# Patient Record
Sex: Female | Born: 1945 | Smoking: Never smoker
Health system: Southern US, Academic
[De-identification: ages and names within clinical notes are randomized; demographics above are authoritative.]

## PROBLEM LIST (undated history)

## (undated) DIAGNOSIS — F329 Major depressive disorder, single episode, unspecified: Secondary | ICD-10-CM

## (undated) DIAGNOSIS — K219 Gastro-esophageal reflux disease without esophagitis: Secondary | ICD-10-CM

## (undated) DIAGNOSIS — Z923 Personal history of irradiation: Secondary | ICD-10-CM

## (undated) DIAGNOSIS — J069 Acute upper respiratory infection, unspecified: Secondary | ICD-10-CM

## (undated) DIAGNOSIS — M1990 Unspecified osteoarthritis, unspecified site: Secondary | ICD-10-CM

## (undated) DIAGNOSIS — R011 Cardiac murmur, unspecified: Secondary | ICD-10-CM

## (undated) DIAGNOSIS — C50919 Malignant neoplasm of unspecified site of unspecified female breast: Secondary | ICD-10-CM

## (undated) DIAGNOSIS — E785 Hyperlipidemia, unspecified: Secondary | ICD-10-CM

## (undated) DIAGNOSIS — B029 Zoster without complications: Secondary | ICD-10-CM

## (undated) DIAGNOSIS — F32A Depression, unspecified: Secondary | ICD-10-CM

## (undated) DIAGNOSIS — I10 Essential (primary) hypertension: Secondary | ICD-10-CM

## (undated) DIAGNOSIS — N951 Menopausal and female climacteric states: Secondary | ICD-10-CM

## (undated) DIAGNOSIS — Z853 Personal history of malignant neoplasm of breast: Secondary | ICD-10-CM

## (undated) HISTORY — PX: HX CATARACT REMOVAL: SHX102

## (undated) HISTORY — DX: Essential (primary) hypertension: I10

## (undated) HISTORY — DX: Personal history of malignant neoplasm of breast: Z85.3

## (undated) HISTORY — PX: CHOLECYSTECTOMY: SHX55

## (undated) HISTORY — DX: Malignant neoplasm of unspecified site of unspecified female breast: C50.919

## (undated) HISTORY — DX: Zoster without complications: B02.9

## (undated) HISTORY — PX: ABDOMINAL HYSTERECTOMY: SHX81

## (undated) HISTORY — DX: Menopausal and female climacteric states: N95.1

## (undated) HISTORY — PX: APPENDECTOMY: SHX54

## (undated) HISTORY — PX: BUNIONECTOMY: SHX129

## (undated) HISTORY — DX: Acute upper respiratory infection, unspecified: J06.9

## (undated) HISTORY — DX: Hyperlipidemia, unspecified: E78.5

---
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
## 1998-11-03 ENCOUNTER — Other Ambulatory Visit: Admission: RE | Admit: 1998-11-03 | Discharge: 1998-11-03 | Payer: Self-pay | Admitting: *Deleted

## 2001-03-14 ENCOUNTER — Encounter: Admission: RE | Admit: 2001-03-14 | Discharge: 2001-03-14 | Payer: Self-pay

## 2002-05-13 ENCOUNTER — Encounter: Admission: RE | Admit: 2002-05-13 | Discharge: 2002-05-13 | Payer: Self-pay

## 2003-05-28 ENCOUNTER — Encounter: Admission: RE | Admit: 2003-05-28 | Discharge: 2003-05-28 | Payer: Self-pay

## 2003-09-20 ENCOUNTER — Ambulatory Visit (HOSPITAL_COMMUNITY): Admission: RE | Admit: 2003-09-20 | Discharge: 2003-09-20 | Payer: Self-pay | Admitting: Gastroenterology

## 2003-09-21 ENCOUNTER — Ambulatory Visit (HOSPITAL_COMMUNITY): Admission: RE | Admit: 2003-09-21 | Discharge: 2003-09-21 | Payer: Self-pay | Admitting: Gastroenterology

## 2004-12-25 ENCOUNTER — Encounter: Admission: RE | Admit: 2004-12-25 | Discharge: 2004-12-25 | Payer: Self-pay | Admitting: Family Medicine

## 2005-05-14 DIAGNOSIS — C50919 Malignant neoplasm of unspecified site of unspecified female breast: Secondary | ICD-10-CM

## 2005-05-14 DIAGNOSIS — Z923 Personal history of irradiation: Secondary | ICD-10-CM

## 2005-05-14 HISTORY — DX: Malignant neoplasm of unspecified site of unspecified female breast: C50.919

## 2005-05-14 HISTORY — PX: BREAST BIOPSY: SHX20

## 2005-05-14 HISTORY — DX: Personal history of irradiation: Z92.3

## 2006-01-12 HISTORY — PX: BREAST LUMPECTOMY: SHX2

## 2006-01-24 ENCOUNTER — Encounter: Admission: RE | Admit: 2006-01-24 | Discharge: 2006-01-24 | Payer: Self-pay | Admitting: Family Medicine

## 2006-01-31 ENCOUNTER — Encounter: Admission: RE | Admit: 2006-01-31 | Discharge: 2006-01-31 | Payer: Self-pay | Admitting: Family Medicine

## 2006-01-31 ENCOUNTER — Encounter (INDEPENDENT_AMBULATORY_CARE_PROVIDER_SITE_OTHER): Payer: Self-pay | Admitting: *Deleted

## 2006-01-31 ENCOUNTER — Encounter (INDEPENDENT_AMBULATORY_CARE_PROVIDER_SITE_OTHER): Payer: Self-pay | Admitting: Diagnostic Radiology

## 2006-02-12 ENCOUNTER — Ambulatory Visit (HOSPITAL_COMMUNITY): Admission: RE | Admit: 2006-02-12 | Discharge: 2006-02-12 | Payer: Self-pay | Admitting: General Surgery

## 2006-02-19 ENCOUNTER — Encounter: Admission: RE | Admit: 2006-02-19 | Discharge: 2006-02-19 | Payer: Self-pay | Admitting: General Surgery

## 2006-02-21 ENCOUNTER — Ambulatory Visit (HOSPITAL_BASED_OUTPATIENT_CLINIC_OR_DEPARTMENT_OTHER): Admission: RE | Admit: 2006-02-21 | Discharge: 2006-02-21 | Payer: Self-pay | Admitting: General Surgery

## 2006-02-21 ENCOUNTER — Encounter (INDEPENDENT_AMBULATORY_CARE_PROVIDER_SITE_OTHER): Payer: Self-pay | Admitting: *Deleted

## 2006-02-21 ENCOUNTER — Encounter: Admission: RE | Admit: 2006-02-21 | Discharge: 2006-02-21 | Payer: Self-pay | Admitting: General Surgery

## 2006-02-22 ENCOUNTER — Ambulatory Visit: Payer: Self-pay | Admitting: Oncology

## 2006-03-07 ENCOUNTER — Ambulatory Visit: Admission: RE | Admit: 2006-03-07 | Discharge: 2006-06-05 | Payer: Self-pay | Admitting: Radiation Oncology

## 2006-03-14 ENCOUNTER — Ambulatory Visit (HOSPITAL_COMMUNITY): Admission: RE | Admit: 2006-03-14 | Discharge: 2006-03-14 | Payer: Self-pay | Admitting: General Surgery

## 2006-04-10 ENCOUNTER — Ambulatory Visit: Payer: Self-pay | Admitting: Oncology

## 2006-06-13 ENCOUNTER — Ambulatory Visit: Payer: Self-pay | Admitting: Oncology

## 2006-10-17 ENCOUNTER — Ambulatory Visit: Payer: Self-pay | Admitting: Oncology

## 2006-10-21 LAB — CBC WITH DIFFERENTIAL/PLATELET
Basophils Absolute: 0 10*3/uL (ref 0.0–0.1)
Eosinophils Absolute: 0 10*3/uL (ref 0.0–0.5)
HCT: 36.7 % (ref 34.8–46.6)
HGB: 13 g/dL (ref 11.6–15.9)
MCV: 92.4 fL (ref 81.0–101.0)
NEUT#: 3.9 10*3/uL (ref 1.5–6.5)
RDW: 12.9 % (ref 11.3–14.5)
lymph#: 1.2 10*3/uL (ref 0.9–3.3)

## 2006-10-29 ENCOUNTER — Encounter: Admission: RE | Admit: 2006-10-29 | Discharge: 2006-10-29 | Payer: Self-pay | Admitting: Radiation Oncology

## 2007-02-27 ENCOUNTER — Encounter: Admission: RE | Admit: 2007-02-27 | Discharge: 2007-02-27 | Payer: Self-pay | Admitting: Family Medicine

## 2007-03-05 ENCOUNTER — Other Ambulatory Visit: Admission: RE | Admit: 2007-03-05 | Discharge: 2007-03-05 | Payer: Self-pay | Admitting: Family Medicine

## 2007-03-12 ENCOUNTER — Encounter: Admission: RE | Admit: 2007-03-12 | Discharge: 2007-03-12 | Payer: Self-pay | Admitting: Family Medicine

## 2007-10-16 ENCOUNTER — Ambulatory Visit: Payer: Self-pay | Admitting: Oncology

## 2007-10-20 LAB — CBC WITH DIFFERENTIAL/PLATELET
Basophils Absolute: 0 10*3/uL (ref 0.0–0.1)
EOS%: 0 % (ref 0.0–7.0)
Eosinophils Absolute: 0 10*3/uL (ref 0.0–0.5)
HCT: 37.2 % (ref 34.8–46.6)
HGB: 13 g/dL (ref 11.6–15.9)
MCH: 32.2 pg (ref 26.0–34.0)
MONO#: 0.4 10*3/uL (ref 0.1–0.9)
NEUT#: 3.2 10*3/uL (ref 1.5–6.5)
RDW: 13.1 % (ref 11.3–14.5)
WBC: 4.9 10*3/uL (ref 3.9–10.0)
lymph#: 1.3 10*3/uL (ref 0.9–3.3)

## 2007-10-21 LAB — COMPREHENSIVE METABOLIC PANEL
AST: 32 U/L (ref 0–37)
BUN: 16 mg/dL (ref 6–23)
Calcium: 9.1 mg/dL (ref 8.4–10.5)
Chloride: 103 mEq/L (ref 96–112)
Creatinine, Ser: 0.76 mg/dL (ref 0.40–1.20)
Total Bilirubin: 0.3 mg/dL (ref 0.3–1.2)

## 2007-10-21 LAB — VITAMIN D 25 HYDROXY (VIT D DEFICIENCY, FRACTURES): Vit D, 25-Hydroxy: 42 ng/mL (ref 30–89)

## 2008-01-08 ENCOUNTER — Ambulatory Visit: Payer: Self-pay | Admitting: Psychiatry

## 2008-03-31 ENCOUNTER — Encounter: Admission: RE | Admit: 2008-03-31 | Discharge: 2008-03-31 | Payer: Self-pay | Admitting: Family Medicine

## 2008-09-17 ENCOUNTER — Ambulatory Visit: Payer: Self-pay | Admitting: Oncology

## 2008-09-21 LAB — CBC WITH DIFFERENTIAL/PLATELET
Basophils Absolute: 0 10*3/uL (ref 0.0–0.1)
HCT: 36.3 % (ref 34.8–46.6)
HGB: 12.8 g/dL (ref 11.6–15.9)
LYMPH%: 21.3 % (ref 14.0–49.7)
MCHC: 35.2 g/dL (ref 31.5–36.0)
MONO#: 0.4 10*3/uL (ref 0.1–0.9)
NEUT%: 71.9 % (ref 38.4–76.8)
Platelets: 168 10*3/uL (ref 145–400)
WBC: 5.7 10*3/uL (ref 3.9–10.3)
lymph#: 1.2 10*3/uL (ref 0.9–3.3)

## 2008-09-21 LAB — COMPREHENSIVE METABOLIC PANEL
BUN: 11 mg/dL (ref 6–23)
CO2: 24 mEq/L (ref 19–32)
Calcium: 8.9 mg/dL (ref 8.4–10.5)
Chloride: 101 mEq/L (ref 96–112)
Creatinine, Ser: 0.89 mg/dL (ref 0.40–1.20)
Glucose, Bld: 135 mg/dL — ABNORMAL HIGH (ref 70–99)
Total Bilirubin: 0.3 mg/dL (ref 0.3–1.2)

## 2008-10-05 LAB — RESEARCH LABS

## 2009-02-17 ENCOUNTER — Ambulatory Visit: Payer: Self-pay | Admitting: Oncology

## 2009-02-21 LAB — RESEARCH LABS

## 2009-04-01 ENCOUNTER — Encounter: Admission: RE | Admit: 2009-04-01 | Discharge: 2009-04-01 | Payer: Self-pay | Admitting: Internal Medicine

## 2010-02-13 ENCOUNTER — Ambulatory Visit: Payer: Self-pay | Admitting: Oncology

## 2010-02-22 LAB — CBC WITH DIFFERENTIAL/PLATELET
BASO%: 0.2 % (ref 0.0–2.0)
EOS%: 0 % (ref 0.0–7.0)
MCH: 31 pg (ref 25.1–34.0)
MCHC: 33.4 g/dL (ref 31.5–36.0)
MCV: 93.1 fL (ref 79.5–101.0)
MONO%: 10.3 % (ref 0.0–14.0)
RBC: 4.02 10*6/uL (ref 3.70–5.45)
RDW: 13.3 % (ref 11.2–14.5)
lymph#: 1 10*3/uL (ref 0.9–3.3)

## 2010-02-23 LAB — COMPREHENSIVE METABOLIC PANEL
ALT: 17 U/L (ref 0–35)
AST: 26 U/L (ref 0–37)
Albumin: 4.1 g/dL (ref 3.5–5.2)
Alkaline Phosphatase: 39 U/L (ref 39–117)
BUN: 16 mg/dL (ref 6–23)
Calcium: 9.3 mg/dL (ref 8.4–10.5)
Chloride: 103 mEq/L (ref 96–112)
Potassium: 4.1 mEq/L (ref 3.5–5.3)

## 2010-04-03 ENCOUNTER — Encounter: Admission: RE | Admit: 2010-04-03 | Discharge: 2010-04-03 | Payer: Self-pay | Admitting: Oncology

## 2010-09-29 NOTE — Op Note (Signed)
[name], Katelyn Knight              ACCOUNT NO.:  [id number]   MEDICAL RECORD NO.:  [id number]          PATIENT TYPE:  AMB   LOCATION:  DAY                          FACILITY:  [facility]   PHYSICIAN:  Rose Phi. Maple Hudson, M.D.   DATE OF BIRTH:  03-05-1946   DATE OF PROCEDURE:  03/14/2006  DATE OF DISCHARGE:                                 OPERATIVE REPORT   PREOPERATIVE DIAGNOSIS:  Stage I carcinoma of right breast.   POSTOPERATIVE DIAGNOSIS:  Stage I carcinoma of right breast.   OPERATION:  Insertion of MammoSite catheter.   SURGEON:  Rose Phi. Maple Hudson, M.D.   ANESTHESIA:  General.   OPERATIVE PROCEDURE:  The patient preferred general anesthesia and, after  general anesthesia been induced, she was placed in supine position with the  arms extended on the arm board.  The right breast was prepped and draped in  usual fashion.   We had actually ultrasounded her space and it looked regular and it looked  like the smaller balloon with 65-70 mL would be appropriate fit.   I made a short incision of the very lateral part of the old lumpectomy  incision which was fairly lateral anyway.  Easily then, using the hemostat,  punched into the seroma cavity and aspirated it.  We irrigated it.  We then  took the balloon and inserted it and I expanded it to 65 mL.   We then reultrasounded this and she had 1.3 cm skin to balloon distance and  it looked like there were no air pockets or irregularities in it.  The  balloon fit perfectly.   One 4-0 nylon suture was then placed in the skin and then there were no  sutures attached to the catheter.  Dressings were then applied and she was  transferred to recovery room in satisfactory condition having tolerated  procedure well.  She is to have a CT scan follow-up in 24 hours and  radiation oncology.      Rose Phi. Maple Hudson, M.D.  Electronically Signed     PRY/MEDQ  D:  03/14/2006  T:  03/14/2006  Job:  [id number]

## 2011-01-20 ENCOUNTER — Other Ambulatory Visit: Payer: Self-pay | Admitting: Internal Medicine

## 2011-02-22 ENCOUNTER — Other Ambulatory Visit: Payer: Self-pay | Admitting: Oncology

## 2011-02-22 ENCOUNTER — Encounter (HOSPITAL_BASED_OUTPATIENT_CLINIC_OR_DEPARTMENT_OTHER): Payer: BC Managed Care – PPO | Admitting: Oncology

## 2011-02-22 DIAGNOSIS — C50419 Malignant neoplasm of upper-outer quadrant of unspecified female breast: Secondary | ICD-10-CM

## 2011-02-22 DIAGNOSIS — C50919 Malignant neoplasm of unspecified site of unspecified female breast: Secondary | ICD-10-CM

## 2011-02-22 DIAGNOSIS — Z170 Estrogen receptor positive status [ER+]: Secondary | ICD-10-CM

## 2011-02-22 LAB — CBC WITH DIFFERENTIAL/PLATELET
Basophils Absolute: 0 10*3/uL (ref 0.0–0.1)
Eosinophils Absolute: 0 10*3/uL (ref 0.0–0.5)
HGB: 12.6 g/dL (ref 11.6–15.9)
LYMPH%: 30.2 % (ref 14.0–49.7)
MCV: 90.8 fL (ref 79.5–101.0)
MONO%: 10.5 % (ref 0.0–14.0)
NEUT#: 2.7 10*3/uL (ref 1.5–6.5)
NEUT%: 58.9 % (ref 38.4–76.8)
Platelets: 200 10*3/uL (ref 145–400)

## 2011-02-22 LAB — COMPREHENSIVE METABOLIC PANEL
ALT: 18 U/L (ref 0–35)
AST: 29 U/L (ref 0–37)
Alkaline Phosphatase: 48 U/L (ref 39–117)
Chloride: 99 mEq/L (ref 96–112)
Creatinine, Ser: 0.86 mg/dL (ref 0.50–1.10)
Total Bilirubin: 0.2 mg/dL — ABNORMAL LOW (ref 0.3–1.2)

## 2011-03-01 ENCOUNTER — Encounter (HOSPITAL_BASED_OUTPATIENT_CLINIC_OR_DEPARTMENT_OTHER): Payer: BC Managed Care – PPO | Admitting: Oncology

## 2011-03-01 DIAGNOSIS — C50919 Malignant neoplasm of unspecified site of unspecified female breast: Secondary | ICD-10-CM

## 2011-03-01 DIAGNOSIS — Z170 Estrogen receptor positive status [ER+]: Secondary | ICD-10-CM

## 2011-04-18 ENCOUNTER — Ambulatory Visit: Payer: BC Managed Care – PPO | Admitting: Internal Medicine

## 2011-05-04 ENCOUNTER — Other Ambulatory Visit: Payer: Self-pay | Admitting: Oncology

## 2011-05-04 DIAGNOSIS — C50419 Malignant neoplasm of upper-outer quadrant of unspecified female breast: Secondary | ICD-10-CM

## 2011-05-17 ENCOUNTER — Other Ambulatory Visit: Payer: Self-pay | Admitting: *Deleted

## 2011-05-17 DIAGNOSIS — C50419 Malignant neoplasm of upper-outer quadrant of unspecified female breast: Secondary | ICD-10-CM

## 2011-05-17 MED ORDER — VENLAFAXINE HCL ER 150 MG PO CP24: 150.0000 mg | ORAL_CAPSULE | Freq: Every day | ORAL | Status: DC

## 2011-05-30 ENCOUNTER — Encounter: Payer: Self-pay | Admitting: Internal Medicine

## 2011-05-30 ENCOUNTER — Ambulatory Visit (INDEPENDENT_AMBULATORY_CARE_PROVIDER_SITE_OTHER): Payer: Medicare Other | Admitting: Internal Medicine

## 2011-05-30 VITALS — BP 98/52 | HR 64 | Temp 97.6°F | Ht 65.0 in | Wt 159.0 lb

## 2011-05-30 DIAGNOSIS — K219 Gastro-esophageal reflux disease without esophagitis: Secondary | ICD-10-CM

## 2011-05-30 DIAGNOSIS — E785 Hyperlipidemia, unspecified: Secondary | ICD-10-CM

## 2011-05-30 DIAGNOSIS — I10 Essential (primary) hypertension: Secondary | ICD-10-CM

## 2011-05-30 DIAGNOSIS — G629 Polyneuropathy, unspecified: Secondary | ICD-10-CM | POA: Insufficient documentation

## 2011-05-30 MED ORDER — OMEPRAZOLE-SODIUM BICARBONATE 40-1100 MG PO CAPS: 1.0000 | ORAL_CAPSULE | Freq: Every day | ORAL | Status: DC

## 2011-05-30 MED ORDER — GABAPENTIN 300 MG PO CAPS: 300.0000 mg | ORAL_CAPSULE | Freq: Every day | ORAL | Status: DC

## 2011-05-30 MED ORDER — CHOLINE FENOFIBRATE 135 MG PO CPDR: 135.0000 mg | DELAYED_RELEASE_CAPSULE | Freq: Every day | ORAL | Status: DC

## 2011-05-30 MED ORDER — VALSARTAN-HYDROCHLOROTHIAZIDE 320-25 MG PO TABS: 1.0000 | ORAL_TABLET | Freq: Every day | ORAL | Status: DC

## 2011-05-30 NOTE — Progress Notes (Signed)
Subjective:    Patient ID: Katelyn Knight, female    DOB: 09/17/1945, 66 y.o.   MRN: [id number]  HPI 66 year old female with a history of hypertension presents for followup. She reports that she's been doing well. She reports full compliance with her medications. She denies any headache, palpitations, chest pain. She did not bring a record of her blood pressures today. She has no other concerns today.  Outpatient Encounter Prescriptions as of 05/30/2011  Medication Sig Dispense Refill  . ACTONEL 150 MG tablet TAKE 1 TABLET EVERY MONTH  4 tablet  2  . Choline Fenofibrate (TRILIPIX) 135 MG capsule Take 1 capsule (135 mg total) by mouth daily.  90 capsule  3  . gabapentin (NEURONTIN) 300 MG capsule Take 1 capsule (300 mg total) by mouth at bedtime.  90 capsule  3  . Grape Seed 100 MG CAPS Take 1 capsule by mouth daily.      . Misc Natural Products (OSTEO BI-FLEX ADV DOUBLE ST PO) Take by mouth.      . Multiple Vitamins-Minerals (MULTIVITAMIN WITH MINERALS) tablet Take 1 tablet by mouth daily.      Marland Kitchen omeprazole-sodium bicarbonate (ZEGERID) 40-1100 MG per capsule Take 1 capsule by mouth daily before breakfast.  90 capsule  3  . valsartan-hydrochlorothiazide (DIOVAN-HCT) 320-25 MG per tablet Take 1 tablet by mouth daily.  90 tablet  3  . venlafaxine (EFFEXOR-XR) 150 MG 24 hr capsule Take 1 capsule (150 mg total) by mouth daily.  90 capsule  0    Review of Systems  Constitutional: Negative for fever, chills, appetite change, fatigue and unexpected weight change.  HENT: Negative for ear pain, congestion, sore throat, trouble swallowing, neck pain, voice change and sinus pressure.   Eyes: Negative for visual disturbance.  Respiratory: Negative for cough, shortness of breath, wheezing and stridor.   Cardiovascular: Negative for chest pain, palpitations and leg swelling.  Gastrointestinal: Negative for nausea, vomiting, abdominal pain, diarrhea, constipation, blood in stool, abdominal distention and  anal bleeding.  Genitourinary: Negative for dysuria and flank pain.  Musculoskeletal: Negative for myalgias, arthralgias and gait problem.  Skin: Negative for color change and rash.  Neurological: Negative for dizziness and headaches.  Hematological: Negative for adenopathy. Does not bruise/bleed easily.  Psychiatric/Behavioral: Negative for suicidal ideas, sleep disturbance and dysphoric mood. The patient is not nervous/anxious.    BP 98/52  Pulse 64  Temp(Src) 97.6 F (36.4 C) (Oral)  Ht 5\' 5"  (1.651 m)  Wt 159 lb (72.122 kg)  BMI 26.46 kg/m2  SpO2 99%     Objective:   Physical Exam  Constitutional: She is oriented to person, place, and time. She appears well-developed and well-nourished. No distress.  HENT:  Head: Normocephalic and atraumatic.  Right Ear: External ear normal.  Left Ear: External ear normal.  Nose: Nose normal.  Mouth/Throat: Oropharynx is clear and moist. No oropharyngeal exudate.  Eyes: Conjunctivae are normal. Pupils are equal, round, and reactive to light. Right eye exhibits no discharge. Left eye exhibits no discharge. No scleral icterus.  Neck: Normal range of motion. Neck supple. No tracheal deviation present. No thyromegaly present.  Cardiovascular: Normal rate, regular rhythm, normal heart sounds and intact distal pulses.  Exam reveals no gallop and no friction rub.   No murmur heard. Pulmonary/Chest: Effort normal and breath sounds normal. No respiratory distress. She has no wheezes. She has no rales. She exhibits no tenderness.  Musculoskeletal: Normal range of motion. She exhibits no edema and no tenderness.  Lymphadenopathy:  She has no cervical adenopathy.  Neurological: She is alert and oriented to person, place, and time. No cranial nerve deficit. She exhibits normal muscle tone. Coordination normal.  Skin: Skin is warm and dry. No rash noted. She is not diaphoretic. No erythema. No pallor.  Psychiatric: She has a normal mood and affect. Her  behavior is normal. Judgment and thought content normal.          Assessment & Plan:  1. Hypertension - BP well controlled today. Will continue current medications. Repeat renal function with labs today. Follow up 6 months.

## 2011-06-15 ENCOUNTER — Other Ambulatory Visit: Payer: Self-pay | Admitting: Internal Medicine

## 2011-06-15 DIAGNOSIS — Z9889 Other specified postprocedural states: Secondary | ICD-10-CM

## 2011-06-15 DIAGNOSIS — Z853 Personal history of malignant neoplasm of breast: Secondary | ICD-10-CM

## 2011-06-18 ENCOUNTER — Encounter: Payer: Self-pay | Admitting: Internal Medicine

## 2011-06-22 ENCOUNTER — Telehealth: Payer: Self-pay | Admitting: *Deleted

## 2011-06-22 DIAGNOSIS — C50419 Malignant neoplasm of upper-outer quadrant of unspecified female breast: Secondary | ICD-10-CM

## 2011-06-22 NOTE — Telephone Encounter (Signed)
Patient requesting a call regarding a prescription.

## 2011-06-25 MED ORDER — VENLAFAXINE HCL ER 150 MG PO CP24: 150.0000 mg | ORAL_CAPSULE | Freq: Every day | ORAL | Status: DC

## 2011-06-25 NOTE — Telephone Encounter (Signed)
Cost is over 200$ thru mail order, sent to local pharm, she will call back if anything further is needed.

## 2011-06-27 ENCOUNTER — Ambulatory Visit
Admission: RE | Admit: 2011-06-27 | Discharge: 2011-06-27 | Disposition: A | Discharge status: Home / Self Care | Payer: Medicare Other | Source: Ambulatory Visit | Attending: Internal Medicine | Admitting: Internal Medicine

## 2011-06-27 ENCOUNTER — Telehealth: Payer: Self-pay | Admitting: Internal Medicine

## 2011-06-27 DIAGNOSIS — Z9889 Other specified postprocedural states: Secondary | ICD-10-CM

## 2011-06-27 DIAGNOSIS — Z853 Personal history of malignant neoplasm of breast: Secondary | ICD-10-CM

## 2011-06-27 NOTE — Telephone Encounter (Signed)
She can use generic effexor.  If she would like to change to another medication, needs a visit to discuss.

## 2011-06-27 NOTE — Telephone Encounter (Signed)
busy

## 2011-06-27 NOTE — Telephone Encounter (Signed)
The depression medication she is taking is to expensive is there another medication she can take that is not so expensive that could take the place of Effexor or its generic.

## 2011-07-02 NOTE — Telephone Encounter (Signed)
Patient informed, Generic effexor is still over 100$, I scheduled her to come in next week to discuss alternatives

## 2011-07-06 LAB — HM MAMMOGRAPHY: HM Mammogram: NORMAL

## 2011-07-11 ENCOUNTER — Ambulatory Visit (INDEPENDENT_AMBULATORY_CARE_PROVIDER_SITE_OTHER): Payer: Medicare Other | Admitting: Internal Medicine

## 2011-07-11 ENCOUNTER — Encounter: Payer: Self-pay | Admitting: Internal Medicine

## 2011-07-11 VITALS — BP 98/60 | HR 81 | Temp 98.1°F | Ht 65.0 in | Wt 160.0 lb

## 2011-07-11 DIAGNOSIS — F329 Major depressive disorder, single episode, unspecified: Secondary | ICD-10-CM

## 2011-07-11 DIAGNOSIS — C50419 Malignant neoplasm of upper-outer quadrant of unspecified female breast: Secondary | ICD-10-CM

## 2011-07-11 MED ORDER — VENLAFAXINE HCL ER 150 MG PO CP24: 150.0000 mg | ORAL_CAPSULE | Freq: Every day | ORAL | Status: DC

## 2011-07-11 NOTE — Assessment & Plan Note (Signed)
Symptoms well controlled on Effexor. Will try reordering this with her insurance company as she brings records today showing it is covered under Tier 2.

## 2011-07-11 NOTE — Progress Notes (Signed)
  Subjective:    Patient ID: Katelyn Knight, female    DOB: 08/19/1945, 66 y.o.   MRN: [id number]  HPI 66 year old female with history of depression presents to discuss issue with her depression medication. She notes that she was charged over $100 for her Effexor. She brings records today showing that the Effexor should be covered as a Tier 2 medication. She is interested in reordering her medication.   Review of Systems     Objective:   Physical Exam        Assessment & Plan:

## 2011-11-29 ENCOUNTER — Encounter: Payer: Self-pay | Admitting: Internal Medicine

## 2011-11-29 ENCOUNTER — Ambulatory Visit (INDEPENDENT_AMBULATORY_CARE_PROVIDER_SITE_OTHER): Payer: Medicare Other | Admitting: Internal Medicine

## 2011-11-29 VITALS — BP 96/64 | HR 58 | Temp 97.9°F | Ht 65.0 in | Wt 153.8 lb

## 2011-11-29 DIAGNOSIS — I10 Essential (primary) hypertension: Secondary | ICD-10-CM

## 2011-11-29 DIAGNOSIS — E785 Hyperlipidemia, unspecified: Secondary | ICD-10-CM

## 2011-11-29 LAB — COMPREHENSIVE METABOLIC PANEL
ALT: 21 U/L (ref 0–35)
CO2: 30 mEq/L (ref 19–32)
Creatinine, Ser: 1.2 mg/dL (ref 0.4–1.2)
GFR: 46.11 mL/min — ABNORMAL LOW (ref >60.00)
Total Bilirubin: 0.5 mg/dL (ref 0.3–1.2)

## 2011-11-29 LAB — LIPID PANEL
Cholesterol: 226 mg/dL — ABNORMAL HIGH (ref 0–200)
HDL: 62.9 mg/dL (ref >39.00)
VLDL: 19.2 mg/dL (ref 0.0–40.0)

## 2011-11-29 MED ORDER — FENOFIBRATE MICRONIZED 134 MG PO CAPS: 134.0000 mg | ORAL_CAPSULE | Freq: Every day | ORAL | Status: DC

## 2011-11-29 NOTE — Progress Notes (Signed)
Subjective:    Patient ID: Katelyn Knight, female    DOB: [birth year], 66 y.o.   MRN: [id number]  HPI 66 year old female with history of hypertension and hyperlipidemia presents for followup. She reports she is generally feeling well. She reports full compliance with her medications. She notes that her medication, Trilipix, is extremely expensive and she would like to change to generic fenofibrate.  Outpatient Encounter Prescriptions as of 11/29/2011  Medication Sig Dispense Refill  . gabapentin (NEURONTIN) 300 MG capsule Take 1 capsule (300 mg total) by mouth at bedtime.  90 capsule  3  . Misc Natural Products (OSTEO BI-FLEX ADV DOUBLE ST PO) Take by mouth.      . Multiple Vitamins-Minerals (MULTIVITAMIN WITH MINERALS) tablet Take 1 tablet by mouth daily.      Marland Kitchen omeprazole-sodium bicarbonate (ZEGERID) 40-1100 MG per capsule Take 1 capsule by mouth daily before breakfast.  90 capsule  3  . valsartan-hydrochlorothiazide (DIOVAN-HCT) 320-25 MG per tablet Take 1 tablet by mouth daily.  90 tablet  3  . venlafaxine (EFFEXOR-XR) 150 MG 24 hr capsule Take 1 capsule (150 mg total) by mouth daily.  30 capsule  1  . fenofibrate micronized (LOFIBRA) 134 MG capsule Take 1 capsule (134 mg total) by mouth daily before breakfast.  90 capsule  4    Review of Systems  Constitutional: Negative for fever, chills, appetite change, fatigue and unexpected weight change.  HENT: Negative for ear pain, congestion, sore throat, trouble swallowing, neck pain, voice change and sinus pressure.   Eyes: Negative for visual disturbance.  Respiratory: Negative for cough, shortness of breath, wheezing and stridor.   Cardiovascular: Negative for chest pain, palpitations and leg swelling.  Gastrointestinal: Negative for nausea, vomiting, abdominal pain, diarrhea, constipation, blood in stool, abdominal distention and anal bleeding.  Genitourinary: Negative for dysuria and flank pain.  Musculoskeletal: Negative for myalgias,  arthralgias and gait problem.  Skin: Negative for color change and rash.  Neurological: Negative for dizziness and headaches.  Hematological: Negative for adenopathy. Does not bruise/bleed easily.  Psychiatric/Behavioral: Negative for suicidal ideas, disturbed wake/sleep cycle and dysphoric mood. The patient is not nervous/anxious.        Objective:   Physical Exam  Constitutional: She is oriented to person, place, and time. She appears well-developed and well-nourished. No distress.  HENT:  Head: Normocephalic and atraumatic.  Right Ear: External ear normal.  Left Ear: External ear normal.  Nose: Nose normal.  Mouth/Throat: Oropharynx is clear and moist. No oropharyngeal exudate.  Eyes: Conjunctivae are normal. Pupils are equal, round, and reactive to light. Right eye exhibits no discharge. Left eye exhibits no discharge. No scleral icterus.  Neck: Normal range of motion. Neck supple. No tracheal deviation present. No thyromegaly present.  Cardiovascular: Normal rate, regular rhythm, normal heart sounds and intact distal pulses.  Exam reveals no gallop and no friction rub.   No murmur heard. Pulmonary/Chest: Effort normal and breath sounds normal. No respiratory distress. She has no wheezes. She has no rales. She exhibits no tenderness.  Musculoskeletal: Normal range of motion. She exhibits no edema and no tenderness.  Lymphadenopathy:    She has no cervical adenopathy.  Neurological: She is alert and oriented to person, place, and time. No cranial nerve deficit. She exhibits normal muscle tone. Coordination normal.  Skin: Skin is warm and dry. No rash noted. She is not diaphoretic. No erythema. No pallor.  Psychiatric: She has a normal mood and affect. Her behavior is normal. Judgment and thought content  normal.          Assessment & Plan:

## 2011-11-29 NOTE — Assessment & Plan Note (Signed)
Will check lipids and LFTs with labs today. Given cost of Trilipix, will change to generic fenofibrate. Follow up 6 months.

## 2011-11-29 NOTE — Assessment & Plan Note (Signed)
Blood pressure well-controlled on current medications. Will check renal function with labs today. Followup in 6 months or sooner as needed. 

## 2011-12-19 ENCOUNTER — Other Ambulatory Visit: Payer: Self-pay | Admitting: *Deleted

## 2011-12-19 ENCOUNTER — Encounter: Payer: Self-pay | Admitting: Internal Medicine

## 2011-12-19 DIAGNOSIS — F329 Major depressive disorder, single episode, unspecified: Secondary | ICD-10-CM

## 2011-12-19 MED ORDER — VENLAFAXINE HCL ER 150 MG PO CP24: 150.0000 mg | ORAL_CAPSULE | Freq: Every day | ORAL | Status: DC

## 2012-05-14 DIAGNOSIS — B029 Zoster without complications: Secondary | ICD-10-CM

## 2012-05-14 HISTORY — DX: Zoster without complications: B02.9

## 2012-06-04 ENCOUNTER — Encounter: Payer: Self-pay | Admitting: Internal Medicine

## 2012-06-04 ENCOUNTER — Ambulatory Visit (INDEPENDENT_AMBULATORY_CARE_PROVIDER_SITE_OTHER): Payer: Medicare Other | Admitting: Internal Medicine

## 2012-06-04 VITALS — BP 108/68 | HR 68 | Temp 98.1°F | Ht 64.5 in | Wt 148.0 lb

## 2012-06-04 DIAGNOSIS — F3289 Other specified depressive episodes: Secondary | ICD-10-CM

## 2012-06-04 DIAGNOSIS — M25359 Other instability, unspecified hip: Secondary | ICD-10-CM | POA: Insufficient documentation

## 2012-06-04 DIAGNOSIS — I10 Essential (primary) hypertension: Secondary | ICD-10-CM

## 2012-06-04 DIAGNOSIS — Z0000 Encounter for general adult medical examination without abnormal findings: Secondary | ICD-10-CM

## 2012-06-04 DIAGNOSIS — M720 Palmar fascial fibromatosis [Dupuytren]: Secondary | ICD-10-CM

## 2012-06-04 DIAGNOSIS — M2480 Other specific joint derangements of unspecified joint, not elsewhere classified: Secondary | ICD-10-CM

## 2012-06-04 DIAGNOSIS — E785 Hyperlipidemia, unspecified: Secondary | ICD-10-CM

## 2012-06-04 DIAGNOSIS — M24859 Other specific joint derangements of unspecified hip, not elsewhere classified: Secondary | ICD-10-CM

## 2012-06-04 DIAGNOSIS — F329 Major depressive disorder, single episode, unspecified: Secondary | ICD-10-CM

## 2012-06-04 DIAGNOSIS — D649 Anemia, unspecified: Secondary | ICD-10-CM

## 2012-06-04 LAB — CBC WITH DIFFERENTIAL/PLATELET
Basophils Absolute: 0 10*3/uL (ref 0.0–0.1)
Eosinophils Absolute: 0 10*3/uL (ref 0.0–0.7)
HCT: 39.2 % (ref 36.0–46.0)
Hemoglobin: 13.3 g/dL (ref 12.0–15.0)
Lymphs Abs: 1.1 10*3/uL (ref 0.7–4.0)
MCHC: 34 g/dL (ref 30.0–36.0)
Monocytes Relative: 10.9 % (ref 3.0–12.0)
Neutro Abs: 2.5 10*3/uL (ref 1.4–7.7)
RDW: 13.6 % (ref 11.5–14.6)

## 2012-06-04 LAB — COMPREHENSIVE METABOLIC PANEL
Alkaline Phosphatase: 49 U/L (ref 39–117)
BUN: 22 mg/dL (ref 6–23)
Glucose, Bld: 106 mg/dL — ABNORMAL HIGH (ref 70–99)
Sodium: 140 mEq/L (ref 135–145)
Total Bilirubin: 0.7 mg/dL (ref 0.3–1.2)

## 2012-06-04 LAB — LIPID PANEL
Cholesterol: 198 mg/dL (ref 0–200)
LDL Cholesterol: 126 mg/dL — ABNORMAL HIGH (ref 0–99)
Triglycerides: 81 mg/dL (ref 0.0–149.0)
VLDL: 16.2 mg/dL (ref 0.0–40.0)

## 2012-06-04 LAB — HM COLONOSCOPY

## 2012-06-04 MED ORDER — VENLAFAXINE HCL ER 75 MG PO CP24: 150.0000 mg | ORAL_CAPSULE | Freq: Every day | ORAL | Status: DC

## 2012-06-04 MED ORDER — LOSARTAN POTASSIUM-HCTZ 100-12.5 MG PO TABS: 1.0000 | ORAL_TABLET | Freq: Every day | ORAL | Status: DC

## 2012-06-04 MED ORDER — FENOFIBRATE MICRONIZED 134 MG PO CAPS: 134.0000 mg | ORAL_CAPSULE | Freq: Every day | ORAL | Status: DC

## 2012-06-04 MED ORDER — VENLAFAXINE HCL ER 75 MG PO CP24: 75.0000 mg | ORAL_CAPSULE | Freq: Every day | ORAL | Status: DC

## 2012-06-04 NOTE — Assessment & Plan Note (Signed)
Pt notes some intermittent instability of hip joints bilaterally. Denies pain. Exam normal. Discussed core strengthening exercise. Discussed referral to orthopedics for further evaluation, but pt would like to hold off for now.

## 2012-06-04 NOTE — Assessment & Plan Note (Signed)
Noted on exam today. Discussed potential referral to hand surgeon, however pt would like to hold off for now.

## 2012-06-04 NOTE — Progress Notes (Signed)
Subjective:    Patient ID: Katelyn Knight, female    DOB: 12-26-1945, 67 y.o.   MRN: [id number]  HPI  The patient is here for annual Medicare wellness examination and management of other chronic and acute problems.   The risk factors are reflected in the social history.  The roster of all physicians providing medical care to patient - is listed in the Snapshot section of the chart.  Activities of daily living:  The patient is 100% independent in all ADLs: dressing, toileting, feeding as well as independent mobility  Home safety : The patient has smoke detectors in the home. They wear seatbelts.  There are no firearms at home. There is no violence in the home.   There is no risks for hepatitis, STDs or HIV. There is no history of blood transfusion. They have no travel history to infectious disease endemic areas of the world.  The patient has seen their dentist in the last six month.  (Dr. Jarold Motto) They have seen their eye doctor in the last year. (Dr. Lawerance [facility]) No issues with hearing They have deferred audiologic testing in the last year.  They do not  have excessive sun exposure.   Discussed the need for sun protection: hats, long sleeves and use of sunscreen if there is significant sun exposure.   Diet: the importance of a healthy diet is discussed. They do have a healthy diet.  The benefits of regular aerobic exercise were discussed. She goes to gym with friend.  Depression screen: there are no signs or vegative symptoms of depression- irritability, change in appetite, anhedonia, sadness/tearfullness.  Cognitive assessment: the patient manages all their financial and personal affairs and is actively engaged. They could relate day,date,year and events.  The following portions of the patient's history were reviewed and updated as appropriate: allergies, current medications, past family history, past medical history,  past surgical history, past social history  and  problem list.  HCPOA - none at present  Visual acuity was not assessed per patient preference since she has regular follow up with her ophthalmologist. Hearing and body mass index were assessed and reviewed.   During the course of the visit the patient was educated and counseled about appropriate screening and preventive services including : fall prevention , diabetes screening, nutrition counseling, colorectal cancer screening, and recommended immunizations.    Pt is also concerned today about contractures bilateral medial palms. Unsure how long present. Not painful. No limitations on range of motion. Of fingers or grip strength.  Also notes some recent issues with bilateral hips, during which hips seem to slide out of socket. Denies pain. Denies any limitation on activity or gait instability. No falls. Planning to start at local gym.   Outpatient Encounter Prescriptions as of 06/04/2012  Medication Sig Dispense Refill  . fenofibrate micronized (LOFIBRA) 134 MG capsule Take 1 capsule (134 mg total) by mouth daily before breakfast.  90 capsule  4  . Misc Natural Products (OSTEO BI-FLEX ADV DOUBLE ST PO) Take by mouth.      . Multiple Vitamins-Minerals (MULTIVITAMIN WITH MINERALS) tablet Take 1 tablet by mouth daily.      Marland Kitchen venlafaxine XR (EFFEXOR-XR) 75 MG 24 hr capsule Take 1 capsule (75 mg total) by mouth daily.  30 capsule  3  . losartan-hydrochlorothiazide (HYZAAR) 100-12.5 MG per tablet Take 1 tablet by mouth daily.  90 tablet  3   BP 108/68  Pulse 68  Temp 98.1 F (36.7 C) (Oral)  Ht  5' 4.5" (1.638 m)  Wt 148 lb (67.132 kg)  BMI 25.01 kg/m2  SpO2 97%  Review of Systems  Constitutional: Negative for fever, chills, appetite change, fatigue and unexpected weight change.  HENT: Negative for ear pain, congestion, sore throat, trouble swallowing, neck pain, voice change and sinus pressure.   Eyes: Negative for visual disturbance.  Respiratory: Negative for cough, shortness of breath,  wheezing and stridor.   Cardiovascular: Negative for chest pain, palpitations and leg swelling.  Gastrointestinal: Negative for nausea, vomiting, abdominal pain, diarrhea, constipation, blood in stool, abdominal distention and anal bleeding.  Genitourinary: Negative for dysuria and flank pain.  Musculoskeletal: Negative for myalgias, arthralgias and gait problem.  Skin: Negative for color change and rash.  Neurological: Negative for dizziness and headaches.  Hematological: Negative for adenopathy. Does not bruise/bleed easily.  Psychiatric/Behavioral: Negative for suicidal ideas, sleep disturbance and dysphoric mood. The patient is not nervous/anxious.        Objective:   Physical Exam  Constitutional: She is oriented to person, place, and time. She appears well-developed and well-nourished. No distress.  HENT:  Head: Normocephalic and atraumatic.  Right Ear: External ear normal.  Left Ear: External ear normal.  Nose: Nose normal.  Mouth/Throat: Oropharynx is clear and moist. No oropharyngeal exudate.  Eyes: Conjunctivae normal are normal. Pupils are equal, round, and reactive to light. Right eye exhibits no discharge. Left eye exhibits no discharge. No scleral icterus.  Neck: Normal range of motion. Neck supple. No tracheal deviation present. No thyromegaly present.  Cardiovascular: Normal rate, regular rhythm, normal heart sounds and intact distal pulses.  Exam reveals no gallop and no friction rub.   No murmur heard. Pulmonary/Chest: Effort normal and breath sounds normal. No accessory muscle usage. Not tachypneic. No respiratory distress. She has no decreased breath sounds. She has no wheezes. She has no rhonchi. She has no rales. She exhibits no tenderness. Right breast exhibits no inverted nipple, no mass, no nipple discharge, no skin change and no tenderness. Left breast exhibits no inverted nipple, no mass, no nipple discharge, no skin change and no tenderness. Breasts are  symmetrical.    Abdominal: Soft. Bowel sounds are normal. She exhibits no distension. There is no tenderness. There is no rebound and no guarding.  Musculoskeletal: Normal range of motion. She exhibits no edema and no tenderness.       Hands: Lymphadenopathy:    She has no cervical adenopathy.  Neurological: She is alert and oriented to person, place, and time. No cranial nerve deficit. She exhibits normal muscle tone. Coordination normal.  Skin: Skin is warm and dry. No rash noted. She is not diaphoretic. No erythema. No pallor.  Psychiatric: She has a normal mood and affect. Her behavior is normal. Judgment and thought content normal.          Assessment & Plan:

## 2012-06-04 NOTE — Assessment & Plan Note (Signed)
General medical exam normal today including breast exam. Pelvic exam deferred per pt preference as s/p complete hysterectomy. Will check basic labs including CMP, CBC, lipids. Immunizations are UTD except for pneumovax and zostavax which were declined. Pt declines further colonoscopy. Appropriate screening performed. Discussed HCPOA and encouraged pt to consider setting this up. Follow up in 1 month and prn.

## 2012-06-04 NOTE — Assessment & Plan Note (Signed)
Will check lipids and LFTs with labs today. 

## 2012-06-04 NOTE — Assessment & Plan Note (Signed)
Good control of BP on Valsartan-HCTZ, however pt insurance coverage has changed and medication now very expensive. Will change to Losartan-HCTZ. Will plan to have pt RTC for recheck in 1 month.

## 2012-06-04 NOTE — Assessment & Plan Note (Signed)
Pt would like to stop Effexor. Will taper to 75mg  daily x 1 month and then reassess. If doing well, plan to taper to 37.5mg  daily x1 month then stop.

## 2012-06-05 ENCOUNTER — Encounter: Payer: Self-pay | Admitting: Internal Medicine

## 2012-06-06 MED ORDER — FLUOXETINE HCL 20 MG PO TABS: 20.0000 mg | ORAL_TABLET | Freq: Every day | ORAL | Status: DC

## 2012-06-17 ENCOUNTER — Encounter: Payer: Self-pay | Admitting: Internal Medicine

## 2012-06-30 ENCOUNTER — Ambulatory Visit (INDEPENDENT_AMBULATORY_CARE_PROVIDER_SITE_OTHER): Payer: Medicare Other | Admitting: Internal Medicine

## 2012-06-30 ENCOUNTER — Encounter: Payer: Self-pay | Admitting: Internal Medicine

## 2012-06-30 VITALS — BP 102/70 | HR 70 | Temp 97.7°F | Wt 149.0 lb

## 2012-06-30 DIAGNOSIS — F329 Major depressive disorder, single episode, unspecified: Secondary | ICD-10-CM

## 2012-06-30 DIAGNOSIS — I10 Essential (primary) hypertension: Secondary | ICD-10-CM

## 2012-06-30 MED ORDER — VENLAFAXINE HCL ER 150 MG PO CP24: 150.0000 mg | ORAL_CAPSULE | Freq: Every day | ORAL | Status: DC

## 2012-06-30 NOTE — Assessment & Plan Note (Signed)
Symptoms well controlled on Venlafaxine. Will continue. Follow up 6 months and prn.

## 2012-06-30 NOTE — Assessment & Plan Note (Signed)
BP Readings from Last 3 Encounters:  06/30/12 102/70  06/04/12 108/68  11/29/11 96/64   BP well controlled on Losartan-HCTZ. Will continue to monitor. Follow up 6 months and prn.

## 2012-06-30 NOTE — Progress Notes (Signed)
Subjective:    Patient ID: Katelyn Knight, female    DOB: 1946/01/31, 67 y.o.   MRN: [id number]  HPI 67YO female with h/o HTN and depression presents for follow up.  HTN - BP has been well controlled on current medications. Denies chest pain, palpitation, headache. Compliant with meds  Depression - Unable to tolerate Fluoxetine which was started last visit because of hallucinations. Started back on Venlafaxine.  Tolerating well.  Notes decreased libido. Question if any medication may help with this.  Outpatient Encounter Prescriptions as of 06/30/2012  Medication Sig Dispense Refill  . fenofibrate micronized (LOFIBRA) 134 MG capsule Take 1 capsule (134 mg total) by mouth daily before breakfast.  90 capsule  4  . losartan-hydrochlorothiazide (HYZAAR) 100-12.5 MG per tablet Take 1 tablet by mouth daily.  90 tablet  3  . Misc Natural Products (OSTEO BI-FLEX ADV DOUBLE ST PO) Take by mouth.      . Multiple Vitamins-Minerals (MULTIVITAMIN WITH MINERALS) tablet Take 1 tablet by mouth daily.      Marland Kitchen venlafaxine XR (EFFEXOR-XR) 150 MG 24 hr capsule Take 1 capsule (150 mg total) by mouth daily.  90 capsule  4  . [DISCONTINUED] venlafaxine XR (EFFEXOR-XR) 150 MG 24 hr capsule Take 150 mg by mouth daily.       . [DISCONTINUED] FLUoxetine (PROZAC) 20 MG tablet Take 1 tablet (20 mg total) by mouth daily.  30 tablet  3   No facility-administered encounter medications on file as of 06/30/2012.   BP 102/70  Pulse 70  Temp(Src) 97.7 F (36.5 C) (Oral)  Wt 149 lb (67.586 kg)  BMI 25.19 kg/m2  SpO2 97%  Review of Systems  Constitutional: Negative for fever, chills, appetite change, fatigue and unexpected weight change.  HENT: Negative for ear pain, congestion, sore throat, trouble swallowing, neck pain, voice change and sinus pressure.   Eyes: Negative for visual disturbance.  Respiratory: Negative for cough, shortness of breath, wheezing and stridor.   Cardiovascular: Negative for chest pain,  palpitations and leg swelling.  Gastrointestinal: Negative for nausea, vomiting, abdominal pain, diarrhea, constipation, blood in stool, abdominal distention and anal bleeding.  Genitourinary: Negative for dysuria and flank pain.  Musculoskeletal: Negative for myalgias, arthralgias and gait problem.  Skin: Negative for color change and rash.  Neurological: Negative for dizziness and headaches.  Hematological: Negative for adenopathy. Does not bruise/bleed easily.  Psychiatric/Behavioral: Negative for suicidal ideas, sleep disturbance and dysphoric mood. The patient is not nervous/anxious.        Objective:   Physical Exam  Constitutional: She is oriented to person, place, and time. She appears well-developed and well-nourished. No distress.  HENT:  Head: Normocephalic and atraumatic.  Right Ear: External ear normal.  Left Ear: External ear normal.  Nose: Nose normal.  Mouth/Throat: Oropharynx is clear and moist. No oropharyngeal exudate.  Eyes: Conjunctivae are normal. Pupils are equal, round, and reactive to light. Right eye exhibits no discharge. Left eye exhibits no discharge. No scleral icterus.  Neck: Normal range of motion. Neck supple. No tracheal deviation present. No thyromegaly present.  Cardiovascular: Normal rate, regular rhythm, normal heart sounds and intact distal pulses.  Exam reveals no gallop and no friction rub.   No murmur heard. Pulmonary/Chest: Effort normal and breath sounds normal. No respiratory distress. She has no wheezes. She has no rales. She exhibits no tenderness.  Musculoskeletal: Normal range of motion. She exhibits no edema and no tenderness.  Lymphadenopathy:    She has no cervical adenopathy.  Neurological: She is alert and oriented to person, place, and time. No cranial nerve deficit. She exhibits normal muscle tone. Coordination normal.  Skin: Skin is warm and dry. No rash noted. She is not diaphoretic. No erythema. No pallor.  Psychiatric: She has  a normal mood and affect. Her behavior is normal. Judgment and thought content normal.          Assessment & Plan:

## 2012-07-01 ENCOUNTER — Ambulatory Visit
Admission: RE | Admit: 2012-07-01 | Discharge: 2012-07-01 | Disposition: A | Discharge status: Home / Self Care | Payer: Medicare Other | Source: Ambulatory Visit | Attending: Internal Medicine | Admitting: Internal Medicine

## 2012-07-01 ENCOUNTER — Other Ambulatory Visit: Payer: Self-pay | Admitting: Internal Medicine

## 2012-07-01 DIAGNOSIS — C50911 Malignant neoplasm of unspecified site of right female breast: Secondary | ICD-10-CM

## 2012-07-01 DIAGNOSIS — Z0000 Encounter for general adult medical examination without abnormal findings: Secondary | ICD-10-CM

## 2012-08-01 ENCOUNTER — Encounter: Payer: Self-pay | Admitting: Internal Medicine

## 2012-09-18 ENCOUNTER — Encounter: Payer: Self-pay | Admitting: Internal Medicine

## 2012-09-18 ENCOUNTER — Telehealth: Payer: Self-pay | Admitting: Internal Medicine

## 2012-09-18 NOTE — Telephone Encounter (Signed)
Called patient but she was not available, husband gave me cell number but not correct number. Called [phone]

## 2012-09-18 NOTE — Telephone Encounter (Signed)
Patient Information:  Caller Name: Ying  Phone: [phone]  Patient: Katelyn Knight  Gender: Female  DOB: [birth year]  Age: 67 Years  PCP: Ronna Polio (Adults only)  Office Follow Up:  Does the office need to follow up with this patient?: Yes  Instructions For The Office: no appts available within dispositioned time frame krs/can  RN Note:  Patient calling about "burning tingly pain" along right side of chest around to the back.  No rash visible as yet.  Has not had shingles in the past, and has not received shingles vaccine.  Also had tick bite 09/14/12.  Was able to remove the entire tick.  Per rash protocol, disposition See Today in Office; info to office for provider/staff review/appt workin, as no appts available within dispositioned time frame and patient is calling prior to 1400.  May reach patient at [phone].  krs/can  Symptoms  Reason For Call & Symptoms: possible shingles  Reviewed Health History In EMR: Yes  Reviewed Medications In EMR: Yes  Reviewed Allergies In EMR: Yes  Reviewed Surgeries / Procedures: Yes  Date of Onset of Symptoms: 09/17/2012  Guideline(s) Used:  Rash or Redness - Localized  Disposition Per Guideline:   See Today in Office  Reason For Disposition Reached:   Lyme disease suspected (e.g., bull's-eye rash or tick bite / exposure)  Advice Given:  N/A  Patient Will Follow Care Advice:  YES

## 2012-09-19 ENCOUNTER — Telehealth: Payer: Self-pay | Admitting: Internal Medicine

## 2012-09-19 ENCOUNTER — Ambulatory Visit (INDEPENDENT_AMBULATORY_CARE_PROVIDER_SITE_OTHER): Payer: Medicare Other | Admitting: Adult Health

## 2012-09-19 ENCOUNTER — Encounter: Payer: Self-pay | Admitting: Adult Health

## 2012-09-19 VITALS — BP 122/66 | HR 66 | Resp 14 | Wt 144.0 lb

## 2012-09-19 DIAGNOSIS — R208 Other disturbances of skin sensation: Secondary | ICD-10-CM | POA: Insufficient documentation

## 2012-09-19 DIAGNOSIS — R209 Unspecified disturbances of skin sensation: Secondary | ICD-10-CM

## 2012-09-19 MED ORDER — VALACYCLOVIR HCL 1 G PO TABS: ORAL_TABLET | ORAL | Status: DC

## 2012-09-19 MED ORDER — HYDROCODONE-ACETAMINOPHEN 5-325 MG PO TABS: 1.0000 | ORAL_TABLET | Freq: Four times a day (QID) | ORAL | Status: DC | PRN

## 2012-09-19 NOTE — Assessment & Plan Note (Signed)
Possibility that this may be the prodromal period of pain and burning prior to the eruption of vesicles associated with shingles. Usually this occurs 2-3 days prior to the eruption. Start Norco every 6 hours as needed for pain. Since we are approaching the weekend, I have also given patient a prescription for valacyclovir 1000 mg every 8 hours x7 days for her to start taking ONLY if vesicles appear. Patient verbalized understanding.

## 2012-09-19 NOTE — Patient Instructions (Addendum)
  It appears that your superficial pain may be the prodrome of shingles. The burning sensation usually appears 2-3 days prior to the outbreak.  I am prescribing medication for pain. Norco can be taken every 6 hours as needed for pain.  I am also prescribing Valacyclovir (Valtrex) 1000 mg. This is an anti-viral. Only fill this prescription if you develop the shingles lesions. This medication is taken every 8 hours for 7 days.  Please call the office if your symptoms are not improved by Monday.

## 2012-09-19 NOTE — Telephone Encounter (Signed)
Caller: Shristi/Patient; Phone: [phone]; Reason for Call: Pt was returning a call from the office.  Pt states she thinks she might have shingles.  Pt was triaged yesterday (09/18/12), so RN did not re-triage.  RN was able to view in chart where Dr Dan Humphreys had responded back to patient email and said that it could possibly be early shingles and that she should be seen in the office.  RN was able to schedule appt for today (09/19/12) at 1500 with Merton Border, NP

## 2012-09-19 NOTE — Progress Notes (Signed)
  Subjective:    Patient ID: Katelyn Knight, female    DOB: Aug 30, 1945, 67 y.o.   MRN: [id number]  HPI  Patient is a pleasant 67 year old female who presents with superficial burning and painful sensation that starts under her right breast and goes around to her back. Symptoms started 2 days ago. She denies any lesions. She denies deep pain sensation. She describes the pain as "my skin feels like it is burning". Patient has not been able to sleep in the past 2 nights secondary to the symptoms. Patient denies injury, heavy lifting, exercising or anything that could have brought on this pain. She denies fever or chills.   Current Outpatient Prescriptions on File Prior to Visit  Medication Sig Dispense Refill  . fenofibrate micronized (LOFIBRA) 134 MG capsule Take 1 capsule (134 mg total) by mouth daily before breakfast.  90 capsule  4  . Misc Natural Products (OSTEO BI-FLEX ADV DOUBLE ST PO) Take by mouth.      . Multiple Vitamins-Minerals (MULTIVITAMIN WITH MINERALS) tablet Take 1 tablet by mouth daily.      Marland Kitchen venlafaxine XR (EFFEXOR-XR) 150 MG 24 hr capsule Take 1 capsule (150 mg total) by mouth daily.  90 capsule  4   No current facility-administered medications on file prior to visit.    Review of Systems  Constitutional: Positive for chills. Negative for fever.  Respiratory: Negative.   Cardiovascular: Negative.   Gastrointestinal: Negative.   Skin: Negative for rash.       Painful skin sensation on right side under breast around to her back.  Psychiatric/Behavioral: Negative.        Objective:   Physical Exam  Constitutional: She is oriented to person, place, and time. She appears well-developed and well-nourished. No distress.  Pulmonary/Chest: Effort normal. No respiratory distress.  Neurological: She is alert and oriented to person, place, and time.  Skin: Skin is warm and dry. No rash noted. No erythema.  Skin is intact. Painful upon palpation of the skin under right  breast and around towards the back  Psychiatric: She has a normal mood and affect. Her behavior is normal. Judgment and thought content normal.          Assessment & Plan:

## 2012-09-26 ENCOUNTER — Encounter: Payer: Self-pay | Admitting: Internal Medicine

## 2012-11-19 ENCOUNTER — Encounter: Payer: Self-pay | Admitting: Internal Medicine

## 2012-11-19 ENCOUNTER — Telehealth: Payer: Self-pay | Admitting: Internal Medicine

## 2012-11-19 NOTE — Telephone Encounter (Signed)
Patient Information:  Caller Name: Analyce  Phone: [phone]  Patient: Leia, Coletti  Gender: Female  DOB: [birth year]  Age: 67 Years  PCP: Ronna Polio (Adults only)  Office Follow Up:  Does the office need to follow up with this patient?: Yes  Instructions For The Office: Has see today in office disposition   Symptoms  Reason For Call & Symptoms: Niva states she has a UTI. Had onset of pain with urination on 11/18/12. Rates pain an  8 on 1/10 pt scale. No fever. No back or side pain. c/o lower abdominal pressure. Is able to void. Voided blood with blood clots at 0330. Has taken AZO x 2 on 11/19/12 with no reilef. Per urination pain protocol has see today in office disposition due to age > 77. No appt left in [facility]. PLEASE CALL Terricka AT [phone]- HAS SEE TODAY IN OFFICE DISPOSITION DUE TO AGE > 50 per urination pain protocol. Reviewed Health History In EMR: Yes  Reviewed Medications In EMR: Yes  Reviewed Allergies In EMR: Yes  Reviewed Surgeries / Procedures: Yes  Date of Onset of Symptoms: 11/18/2012  Treatments Tried: AZO  Treatments Tried Worked: No  Guideline(s) Used:  Urination Pain - Female  Disposition Per Guideline:   See Today in Office  Reason For Disposition Reached:   Age > 50 years  Advice Given:  Warm Saline SITZ Baths to Reduce Pain:  Sit in a warm saline bath for 20 minutes to cleanse the area and to reduce pain. Add 2 oz. of table salt or baking soda to a tub of water.  Fluids:   Drink extra fluids. Drink 8-10 glasses of liquids a day (Reason: to produce a dilute, non-irritating urine).  Patient Will Follow Care Advice:  YES

## 2012-11-20 ENCOUNTER — Encounter: Payer: Self-pay | Admitting: Adult Health

## 2012-11-20 ENCOUNTER — Ambulatory Visit (INDEPENDENT_AMBULATORY_CARE_PROVIDER_SITE_OTHER): Payer: Medicare Other | Admitting: Adult Health

## 2012-11-20 VITALS — BP 118/66 | HR 64 | Temp 98.0°F | Resp 12 | Wt 144.0 lb

## 2012-11-20 DIAGNOSIS — R300 Dysuria: Secondary | ICD-10-CM | POA: Insufficient documentation

## 2012-11-20 LAB — POCT URINALYSIS DIPSTICK
Protein, UA: NEGATIVE
Spec Grav, UA: <=1.005
Urobilinogen, UA: 0.2

## 2012-11-20 MED ORDER — CIPROFLOXACIN HCL 250 MG PO TABS: 250.0000 mg | ORAL_TABLET | Freq: Two times a day (BID) | ORAL | Status: DC

## 2012-11-20 NOTE — Progress Notes (Signed)
  Subjective:    Patient ID: Katelyn Knight, female    DOB: 12-19-1945, 67 y.o.   MRN: [id number]  HPI  Patient is a pleasant 67 y/o female who presents with dysuria, hematuria and passing blood clots. Denies fever or chills. Symptoms started yesterday.   Current Outpatient Prescriptions on File Prior to Visit  Medication Sig Dispense Refill  . fenofibrate micronized (LOFIBRA) 134 MG capsule Take 1 capsule (134 mg total) by mouth daily before breakfast.  90 capsule  4  . HYDROcodone-acetaminophen (NORCO) 5-325 MG per tablet Take 1 tablet by mouth every 6 (six) hours as needed for pain.  30 tablet  0  . losartan-hydrochlorothiazide (HYZAAR) 100-12.5 MG per tablet Take 0.5 tablets by mouth daily.      . Misc Natural Products (OSTEO BI-FLEX ADV DOUBLE ST PO) Take by mouth.      . Multiple Vitamins-Minerals (MULTIVITAMIN WITH MINERALS) tablet Take 1 tablet by mouth daily.      Marland Kitchen venlafaxine XR (EFFEXOR-XR) 150 MG 24 hr capsule Take 1 capsule (150 mg total) by mouth daily.  90 capsule  4   No current facility-administered medications on file prior to visit.     Review of Systems  Constitutional: Negative for fever, chills and fatigue.  Genitourinary: Positive for dysuria, frequency and hematuria. Negative for flank pain.   BP 118/66  Pulse 64  Temp(Src) 98 F (36.7 C) (Oral)  Resp 12  Wt 144 lb (65.318 kg)  BMI 24.34 kg/m2  SpO2 97%     Objective:   Physical Exam  Constitutional: She is oriented to person, place, and time. She appears well-developed and well-nourished.  Cardiovascular: Normal rate and regular rhythm.   Pulmonary/Chest: Effort normal. No respiratory distress.  Genitourinary:  Suprapubic tenderness. No costovertebral tenderness.  Neurological: She is alert and oriented to person, place, and time.  Psychiatric: She has a normal mood and affect. Her behavior is normal. Judgment and thought content normal.       Assessment & Plan:

## 2012-11-20 NOTE — Assessment & Plan Note (Signed)
UA dipstick shows trace blood, positive nitrites, trace leukocytes. Start Cipro 250 mg twice a day x3 days. Provided patient with 12 tablets. [place] so I provided extra medication in the event her symptoms do not improve within 2-3 days.

## 2012-11-20 NOTE — Telephone Encounter (Signed)
Patient Information:  Caller Name: Deb  Phone: [phone]  Patient: Katelyn Knight, Katelyn Knight  Gender: Female  DOB: 04-01-1946  Age: 67 Years  PCP: Ronna Polio (Adults only)  Office Follow Up:  Does the office need to follow up with this patient?: No  Instructions For The Office: N/A   Symptoms  Reason For Call & Symptoms: Dysuria with urgency; Called 11/19/12 for assistance; after triage, reported no one called her back regarding appointment or treatment. Declined triage.  Denies blood in urine, back or flank pain.  On AzoStandard. Advised must be seen for antibiotics per MD orders.  Late afternoon appointment scheduled per caller request.  Reviewed Health History In EMR: N/A  Reviewed Medications In EMR: N/A  Reviewed Allergies In EMR: N/A  Reviewed Surgeries / Procedures: N/A  Date of Onset of Symptoms: 11/18/2012  Treatments Tried: Azo Standard, > water intake  Treatments Tried Worked: No  Guideline(s) Used:  No Protocol Available - Sick Adult  Disposition Per Guideline:   See Today in Office  Reason For Disposition Reached:   Nursing judgment  Advice Given:  Call Back If:  New symptoms develop  You become worse.  Patient Will Follow Care Advice:  YES  Appointment Scheduled:  11/20/2012 15:00:00 Appointment Scheduled Provider:  Orville Govern

## 2012-11-20 NOTE — Patient Instructions (Addendum)
Urinary Tract Infection  Urinary tract infections (UTIs) can develop anywhere along your urinary tract. Your urinary tract is your body's drainage system for removing wastes and extra water. Your urinary tract includes two kidneys, two ureters, a bladder, and a urethra. Your kidneys are a pair of bean-shaped organs. Each kidney is about the size of your fist. They are located below your ribs, one on each side of your spine.  CAUSES  Infections are caused by microbes, which are microscopic organisms, including fungi, viruses, and bacteria. These organisms are so small that they can only be seen through a microscope. Bacteria are the microbes that most commonly cause UTIs.  SYMPTOMS   Symptoms of UTIs may vary by age and gender of the patient and by the location of the infection. Symptoms in young women typically include a frequent and intense urge to urinate and a painful, burning feeling in the bladder or urethra during urination. Older women and men are more likely to be tired, shaky, and weak and have muscle aches and abdominal pain. A fever may mean the infection is in your kidneys. Other symptoms of a kidney infection include pain in your back or sides below the ribs, nausea, and vomiting.  DIAGNOSIS  To diagnose a UTI, your caregiver will ask you about your symptoms. Your caregiver also will ask to provide a urine sample. The urine sample will be tested for bacteria and white blood cells. White blood cells are made by your body to help fight infection.  TREATMENT   Typically, UTIs can be treated with medication. Because most UTIs are caused by a bacterial infection, they usually can be treated with the use of antibiotics. The choice of antibiotic and length of treatment depend on your symptoms and the type of bacteria causing your infection.  HOME CARE INSTRUCTIONS   If you were prescribed antibiotics, take them exactly as your caregiver instructs you. Finish the medication even if you feel better after you  have only taken some of the medication.   Drink enough water and fluids to keep your urine clear or pale yellow.   Avoid caffeine, tea, and carbonated beverages. They tend to irritate your bladder.   Empty your bladder often. Avoid holding urine for long periods of time.   Empty your bladder before and after sexual intercourse.   After a bowel movement, women should cleanse from front to back. Use each tissue only once.  SEEK MEDICAL CARE IF:    You have back pain.   You develop a fever.   Your symptoms do not begin to resolve within 3 days.  SEEK IMMEDIATE MEDICAL CARE IF:    You have severe back pain or lower abdominal pain.   You develop chills.   You have nausea or vomiting.   You have continued burning or discomfort with urination.  MAKE SURE YOU:    Understand these instructions.   Will watch your condition.   Will get help right away if you are not doing well or get worse.  Document Released: 02/07/2005 Document Revised: 10/30/2011 Document Reviewed: 06/08/2011  [employer] Patient Information 2014 [employer]

## 2012-12-17 ENCOUNTER — Other Ambulatory Visit: Payer: Self-pay

## 2013-01-07 ENCOUNTER — Telehealth: Payer: Self-pay | Admitting: *Deleted

## 2013-01-07 DIAGNOSIS — G629 Polyneuropathy, unspecified: Secondary | ICD-10-CM

## 2013-01-07 MED ORDER — GABAPENTIN 300 MG PO CAPS: 300.0000 mg | ORAL_CAPSULE | Freq: Every day | ORAL | Status: DC

## 2013-01-07 NOTE — Telephone Encounter (Signed)
Patient left a voicemail stating at her last OV, she spoke with Dr. Dan Humphreys about her taking Gabapentin. She would like a prescription sent to the pharmacy, she really can not sleep without it. Please send to [facility]. Please send her a Mychart message when this is done. She would like to know if it's ok to refill or will she need to be seen?

## 2013-01-07 NOTE — Telephone Encounter (Signed)
Fine to refill Gabapentin

## 2013-03-02 ENCOUNTER — Encounter: Payer: Self-pay | Admitting: *Deleted

## 2013-03-03 ENCOUNTER — Encounter: Payer: Self-pay | Admitting: Internal Medicine

## 2013-03-03 ENCOUNTER — Ambulatory Visit (INDEPENDENT_AMBULATORY_CARE_PROVIDER_SITE_OTHER): Payer: Medicare Other | Admitting: Internal Medicine

## 2013-03-03 VITALS — BP 120/68 | HR 67 | Temp 98.2°F | Wt 146.0 lb

## 2013-03-03 DIAGNOSIS — R1031 Right lower quadrant pain: Secondary | ICD-10-CM

## 2013-03-03 DIAGNOSIS — Z23 Encounter for immunization: Secondary | ICD-10-CM

## 2013-03-03 DIAGNOSIS — I10 Essential (primary) hypertension: Secondary | ICD-10-CM

## 2013-03-03 DIAGNOSIS — R109 Unspecified abdominal pain: Secondary | ICD-10-CM

## 2013-03-03 NOTE — Assessment & Plan Note (Signed)
BP Readings from Last 3 Encounters:  03/03/13 120/68  11/20/12 118/66  09/19/12 122/66   BP well controlled on Losartan-HCTZ. Will continue. Plan to check renal function at next visit per pt preference.

## 2013-03-03 NOTE — Progress Notes (Signed)
Subjective:    Patient ID: Katelyn Knight, female    DOB: 05-28-1945, 67 y.o.   MRN: [id number]  HPI 67 year old female with history of hypertension, depression presents for followup. She reports she was able to wean herself off Effexor and onto Prozac alone. She did this because of significant increase in cost of Effexor. She reports symptoms have been well-controlled on fluoxetine alone.  In regards to elevated blood pressure, she questions whether she really needs dose of losartan hydrochlorothiazide. She does not regularly check her blood pressure. She denies any headache, chest pain, palpitations. She does occasionally miss a dose of the medicine.  She is concerned about two-year history of intermittent sharp pain in her groin. This occurs both on the left and right intermittently. The pain is sharp and fleeting. When she develops the pain she develops sudden sensation that her leg will "give way." She has had falls as a result of her legs giving out on her. She denies back pain, trauma to her back, loss of control of bowel or bladder. She denies any persistent weakness in her legs.   Outpatient Encounter Prescriptions as of 03/03/2013  Medication Sig Dispense Refill  . fenofibrate micronized (LOFIBRA) 134 MG capsule Take 1 capsule (134 mg total) by mouth daily before breakfast.  90 capsule  4  . FLUoxetine (PROZAC) 20 MG capsule Take 20 mg by mouth daily.      Marland Kitchen gabapentin (NEURONTIN) 300 MG capsule Take 1 capsule (300 mg total) by mouth at bedtime.  90 capsule  3  . losartan-hydrochlorothiazide (HYZAAR) 100-12.5 MG per tablet Take 0.5 tablets by mouth daily.      . Misc Natural Products (OSTEO BI-FLEX ADV DOUBLE ST PO) Take by mouth.      . Multiple Vitamins-Minerals (MULTIVITAMIN WITH MINERALS) tablet Take 1 tablet by mouth daily.       No facility-administered encounter medications on file as of 03/03/2013.   BP 120/68  Pulse 67  Temp(Src) 98.2 F (36.8 C) (Oral)  Wt 146 lb  (66.225 kg)  BMI 24.68 kg/m2  SpO2 98%  Review of Systems  Constitutional: Negative for fever, chills, appetite change, fatigue and unexpected weight change.  HENT: Negative for congestion, ear pain, sinus pressure, sore throat, trouble swallowing and voice change.   Eyes: Negative for visual disturbance.  Respiratory: Negative for cough, shortness of breath, wheezing and stridor.   Cardiovascular: Negative for chest pain, palpitations and leg swelling.  Gastrointestinal: Negative for nausea, vomiting, abdominal pain, diarrhea, constipation, blood in stool, abdominal distention and anal bleeding.  Genitourinary: Negative for dysuria and flank pain.  Musculoskeletal: Positive for arthralgias and myalgias. Negative for gait problem and neck pain.  Skin: Negative for color change and rash.  Neurological: Positive for weakness (intermittent bilateral leg). Negative for dizziness and headaches.  Hematological: Negative for adenopathy. Does not bruise/bleed easily.  Psychiatric/Behavioral: Negative for suicidal ideas, sleep disturbance and dysphoric mood. The patient is not nervous/anxious.        Objective:   Physical Exam  Constitutional: She is oriented to person, place, and time. She appears well-developed and well-nourished. No distress.  HENT:  Head: Normocephalic and atraumatic.  Right Ear: External ear normal.  Left Ear: External ear normal.  Nose: Nose normal.  Mouth/Throat: Oropharynx is clear and moist. No oropharyngeal exudate.  Eyes: Conjunctivae are normal. Pupils are equal, round, and reactive to light. Right eye exhibits no discharge. Left eye exhibits no discharge. No scleral icterus.  Neck: Normal range of motion.  Neck supple. No tracheal deviation present. No thyromegaly present.  Cardiovascular: Normal rate, regular rhythm, normal heart sounds and intact distal pulses.  Exam reveals no gallop and no friction rub.   No murmur heard. Pulmonary/Chest: Effort normal and  breath sounds normal. No accessory muscle usage. Not tachypneic. No respiratory distress. She has no decreased breath sounds. She has no wheezes. She has no rhonchi. She has no rales. She exhibits no tenderness.  Musculoskeletal: Normal range of motion. She exhibits no edema and no tenderness.  Lymphadenopathy:    She has no cervical adenopathy.  Neurological: She is alert and oriented to person, place, and time. She displays no atrophy and no tremor. No cranial nerve deficit or sensory deficit. She exhibits normal muscle tone. Coordination and gait normal.  Reflex Scores:      Patellar reflexes are 2+ on the right side and 2+ on the left side. Skin: Skin is warm and dry. No rash noted. She is not diaphoretic. No erythema. No pallor.  Psychiatric: She has a normal mood and affect. Her behavior is normal. Judgment and thought content normal.          Assessment & Plan:

## 2013-03-03 NOTE — Assessment & Plan Note (Signed)
Intermittent, sharp bilateral groin pain, L>R, with intermittent weakness in affected leg. Unclear etiology. No h/o back pain, trauma to lower back. Exam normal today. Will set up sports medicine evaluation. Question if MRI lumbar spine might be helpful for further evaluation.

## 2013-03-10 ENCOUNTER — Encounter: Payer: Self-pay | Admitting: Family Medicine

## 2013-03-10 ENCOUNTER — Ambulatory Visit (INDEPENDENT_AMBULATORY_CARE_PROVIDER_SITE_OTHER): Payer: Medicare Other | Admitting: Family Medicine

## 2013-03-10 ENCOUNTER — Other Ambulatory Visit (INDEPENDENT_AMBULATORY_CARE_PROVIDER_SITE_OTHER): Payer: Medicare Other

## 2013-03-10 VITALS — BP 128/62 | HR 66 | Wt 146.0 lb

## 2013-03-10 DIAGNOSIS — R1030 Lower abdominal pain, unspecified: Secondary | ICD-10-CM

## 2013-03-10 DIAGNOSIS — R109 Unspecified abdominal pain: Secondary | ICD-10-CM

## 2013-03-10 DIAGNOSIS — R1031 Right lower quadrant pain: Secondary | ICD-10-CM

## 2013-03-10 NOTE — Assessment & Plan Note (Addendum)
Secondary to chronic adductor tendinopathy and muscle imbalances. I do believe that patient's pain is not affecting her activities of daily living overall is been able to function well. Patient was found to have some weakness with the hip musculature. Patient does go to a gym regularly. Patient was given exercises that she should do on a regular basis. We did discuss over-the-counter medications that can be beneficial for this type of pain. We discussed proper shoe choices. Patient will try all these interventions and come back in 4 weeks. If she continues to have pain at that time I would like to do is ultrasound the area to see if there is any tear or any other abnormalities that would warrant further evaluation and workup.

## 2013-03-10 NOTE — Patient Instructions (Signed)
Very nice to meet you Try these exercises daily  Hip abductor strengthening - lay on side lift top leg 6 inches hold, 2 seconds, and lower slowly for 4 count. Do 30 reps daily, each side for next 2 weeks, then 2 sets each side daily.  Fish oil 3 grams daily Black cohosh for the hot flashes  Tylenol three times a da for the next 5 days.  Ice can help after activity Come back in 4 weeks.

## 2013-03-10 NOTE — Progress Notes (Signed)
I'm seeing this patient by the request  of:  Wynona Dove, MD   CC: Bilateral groin pain  HPI: Patient is a very pleasant 67 year old female who is referred here for bilateral groin pain. Patient states she's had this pain for approximately 2 years. It seems to be more of an intermittent sharp pain in her groin. Patient describes the pain is more of a sharp sensation that goes away fairly quickly. When she has the pain though it seems to radiate a little bit down her legs which causes her to feel like she is going to fall.  Patient denies any numbness or weakness in the lower extremities. Patient also denies any back pain, fevers or chills. Patient does not remember any true injury to this area. Patient states certain movements seem to make it worse. Patient denies any nighttime awakening. Patient has the pain though when it occurs is 8/10. Patient also denies any bowel or bladder incontinence. Patient states she can have days and even weeks where she has no pain.  Past medical, surgical, family and social history reviewed. Medications reviewed all in the electronic medical record.   Review of Systems: No headache, visual changes, nausea, vomiting, diarrhea, constipation, dizziness, abdominal pain, skin rash, fevers, chills, night sweats, weight loss, swollen lymph nodes, body aches, joint swelling, muscle aches, chest pain, shortness of breath, mood changes.   Objective:    Weight 146 lb (66.225 kg).   General: No apparent distress alert and oriented x3 mood and affect normal, dressed appropriately.  HEENT: Pupils equal, extraocular movements intact Respiratory: Patient's speak in full sentences and does not appear short of breath Cardiovascular: No lower extremity edema, non tender, no erythema Skin: Warm dry intact with no signs of infection or rash on extremities or on axial skeleton. Abdomen: Soft nontender Neuro: Cranial nerves II through XII are intact, neurovascularly intact  in all extremities with 2+ DTRs and 2+ pulses. Lymph: No lymphadenopathy of posterior or anterior cervical chain or axillae bilaterally.  Gait normal with good balance and coordination.  MSK: Non tender with full range of motion and good stability and symmetric strength and tone of shoulders, elbows, wrist,  knee and ankles bilaterally.  Back Exam:  Inspection: Unremarkable  Motion: Flexion 45 deg, Extension 45 deg, Side Bending to 45 deg bilaterally,  Rotation to 45 deg bilaterally  SLR laying: Negative  XSLR laying: Negative  Palpable tenderness: None. FABER: negative. Sensory change: Gross sensation intact to all lumbar and sacral dermatomes.  Reflexes: 2+ at both patellar tendons, 2+ at achilles tendons, Babinski's downgoing.  Strength at foot  Plantar-flexion: 5/5 Dorsi-flexion: 5/5 Eversion: 5/5 Inversion: 5/5  Leg strength  Quad: 5/5 Hamstring: 5/5 Hip flexor: 5/5 Hip abductors: 5/5  Gait unremarkable.  Hip: Right ROM IR: 35 Deg, ER: 45 Deg, Flexion: 120 Deg, Extension: 100 Deg, Abduction: 45 Deg, Adduction: 45 Deg Strength IR: 5/5, ER: 5/5, Flexion: 5/5, Extension: 5/5, Abduction: 3/5, Adduction: 3/5 Pelvic alignment unremarkable to inspection and palpation. Standing hip rotation and gait without trendelenburg sign / unsteadiness. Greater trochanter without tenderness to palpation. No tenderness over piriformis and greater trochanter. Mild tenderness at the insertion of the adductors on the inferior pubic rami No pain with FABER or FADIR. No SI joint tenderness and normal minimal SI movement. ZOX:WRUE ROM IR: 35 Deg, ER: 45 Deg, Flexion: 120 Deg, Extension: 100 Deg, Abduction: 45 Deg, Adduction: 45 Deg Strength IR: 5/5, ER: 5/5, Flexion: 5/5, Extension: 5/5, Abduction: 3/5, Adduction: 3/5 Pelvic alignment unremarkable to  inspection and palpation. Standing hip rotation and gait without trendelenburg sign / unsteadiness. Greater trochanter with mild tenderness to  palpation. No tenderness over piriformis. . Mild tenderness at the insertion of the adductor on the inferior pubic rami No pain with FABER or FADIR. No SI joint tenderness and normal minimal SI movement.  Impression and Recommendations:     This case required medical decision making of moderate complexity.

## 2013-04-21 ENCOUNTER — Ambulatory Visit: Payer: Medicare Other | Admitting: Family Medicine

## 2013-05-29 ENCOUNTER — Other Ambulatory Visit: Payer: Self-pay | Admitting: *Deleted

## 2013-05-29 MED ORDER — FLUOXETINE HCL 20 MG PO CAPS: 20.0000 mg | ORAL_CAPSULE | Freq: Every day | ORAL | Status: DC

## 2013-06-09 ENCOUNTER — Encounter: Payer: Self-pay | Admitting: Internal Medicine

## 2013-06-09 ENCOUNTER — Ambulatory Visit (INDEPENDENT_AMBULATORY_CARE_PROVIDER_SITE_OTHER): Payer: Medicare Other | Admitting: Internal Medicine

## 2013-06-09 VITALS — BP 100/66 | HR 53 | Temp 97.4°F | Wt 145.2 lb

## 2013-06-09 DIAGNOSIS — R300 Dysuria: Secondary | ICD-10-CM

## 2013-06-09 DIAGNOSIS — N390 Urinary tract infection, site not specified: Secondary | ICD-10-CM

## 2013-06-09 LAB — POCT URINALYSIS DIPSTICK
Bilirubin, UA: NEGATIVE
Glucose, UA: NEGATIVE
KETONES UA: NEGATIVE
NITRITE UA: POSITIVE
PH UA: 6.5
Spec Grav, UA: 1.025
UROBILINOGEN UA: NEGATIVE

## 2013-06-09 MED ORDER — CIPROFLOXACIN HCL 250 MG PO TABS: 250.0000 mg | ORAL_TABLET | Freq: Two times a day (BID) | ORAL | Status: DC

## 2013-06-09 NOTE — Addendum Note (Signed)
Addended by: Ellamae Sia on: 06/09/2013 03:47 PM   Modules accepted: Orders

## 2013-06-09 NOTE — Progress Notes (Signed)
HPI: Pt presents to the clinic today with c/o urinary symptoms. Pt states symptoms started 3 days ago. Endorses frequency, urgency, and pain with urination with some blood. Pt had left over Cipro pills from previous infection and took for 3 days. Pt has tried OTC Azo for symptoms relief.    Review of Systems  Past Medical History  Diagnosis Date  . Breast cancer     Dr Jana Hakim DX 2007  . Hyperlipidemia   . HTN (hypertension)   . Osteoporosis   . URI (upper respiratory infection)   . Menopausal symptom   . Shingles 2014    No family history on file.  History   Social History  . Marital Status: Married    Spouse Name: N/A    Number of Children: 2  . Years of Education: N/A   Occupational History  . 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 Smoking status: Never Smoker   . Smokeless tobacco: Never Used  . Alcohol Use: Yes     Comment: Occasional  . Drug Use: No  . Sexual Activity: Not on file   Other Topics Concern  . Not on file   Social History Narrative   Regular Exercise -  Not at this time   Daily Caffeine Use:  Diet Dr Malachi Bonds x1, Coffee x 1 cup    No Known Allergies  Constitutional: Denies fever, malaise, fatigue, headache or abrupt weight changes.   GU: Pt reports urgency, frequency and pain with urination. Denies burning sensation, blood in urine, odor or discharge. Skin: Denies redness, rashes, lesions or ulcercations.   No other specific complaints in a complete review of systems (except as listed in HPI above).    Objective:   Physical Exam  There were no vitals taken for this visit. Wt Readings from Last 3 Encounters:  03/10/13 146 lb (66.225 kg)  03/03/13 146 lb (66.225 kg)  11/20/12 144 lb (65.318 kg)    General: Appears her stated age, well developed, well nourished in NAD. Cardiovascular: Normal rate and rhythm. S1,S2 noted.  No murmur, rubs or gallops noted. No JVD or BLE edema. No carotid bruits noted. Pulmonary/Chest:  Normal effort and positive vesicular breath sounds. No respiratory distress. No wheezes, rales or ronchi noted.  Abdomen: Soft and nontender. Normal bowel sounds, no bruits noted. No distention or masses noted. Liver, spleen and kidneys non palpable. Tender to palpation over the bladder area. No CVA tenderness.      Assessment & Plan:   Urgency Frequency Dysuria  Urinalysis was positive for nitrates, mod leukocytes, and blood; sent for culture eRx sent if for Cipro 250mg  po BID for 4 days; take one tablet twice daily for 4 days #8; no refills Can continue taking AZO for symptom relief Drink plenty of fluids  RTC as needed or if symptoms persist.  Macalister Arnaud, Demetrius Charity, Student-NP

## 2013-06-09 NOTE — Progress Notes (Signed)
Pre-visit discussion using our clinic review tool. No additional management support is needed unless otherwise documented below in the visit note.  

## 2013-06-09 NOTE — Progress Notes (Addendum)
HPI  Pt presents to the clinic today with c/o blood clots and dysuria. She reports this started about 4 days ago. She denies fevers, nausea or flank pain. She had some left over cipro, so she took that. She also took AZO this am. She has had UTI's in the past and reports this feels the same.   Review of Systems  Past Medical History  Diagnosis Date  . Breast cancer     Dr Jana Hakim DX 2007  . Hyperlipidemia   . HTN (hypertension)   . Osteoporosis   . URI (upper respiratory infection)   . Menopausal symptom   . Shingles 2014    History reviewed. No pertinent family history.  History   Social History  . Marital Status: Married    Spouse Name: N/A    Number of Children: 2  . Years of Education: N/A   Occupational History  . 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 Smoking status: Never Smoker   . Smokeless tobacco: Never Used  . Alcohol Use: Yes     Comment: Occasional  . Drug Use: No  . Sexual Activity: Not on file   Other Topics Concern  . Not on file   Social History Narrative   Regular Exercise -  Not at this time   Daily Caffeine Use:  Diet Dr Malachi Bonds x1, Coffee x 1 cup    No Known Allergies  Constitutional: Denies fever, malaise, fatigue, headache or abrupt weight changes.   GU: Pt reports urgency, frequency and pain with urination. Denies burning sensation, blood in urine, odor or discharge. Skin: Denies redness, rashes, lesions or ulcercations.   No other specific complaints in a complete review of systems (except as listed in HPI above).    Objective:   Physical Exam  BP 100/66  Pulse 53  Temp(Src) 97.4 F (36.3 C) (Oral)  Wt 145 lb 4 oz (65.885 kg) Wt Readings from Last 3 Encounters:  06/09/13 145 lb 4 oz (65.885 kg)  03/10/13 146 lb (66.225 kg)  03/03/13 146 lb (66.225 kg)    General: Appears her stated age, well developed, well nourished in NAD. Cardiovascular: Normal rate and rhythm. S1,S2 noted.  No murmur, rubs or gallops  noted. No JVD or BLE edema. No carotid bruits noted. Pulmonary/Chest: Normal effort and positive vesicular breath sounds. No respiratory distress. No wheezes, rales or ronchi noted.  Abdomen: Soft and nontender. Normal bowel sounds, no bruits noted. No distention or masses noted. Liver, spleen and kidneys non palpable. Tender to palpation over the bladder area. No CVA tenderness.      Assessment & Plan:   Dysuria secondary to UTI:  Urinalysis: mod leuks, pos nitrites, trace blood eRx sent if for Cipro 250 mg BID x 5 days Ok to continue AZO OTC Drink plenty of fluids  RTC as needed or if symptoms persist.

## 2013-06-09 NOTE — Addendum Note (Signed)
Addended by: Lurlean Nanny on: 06/09/2013 02:33 PM   Modules accepted: Orders

## 2013-06-09 NOTE — Addendum Note (Signed)
Addended by: Lurlean Nanny on: 06/09/2013 02:51 PM   Modules accepted: Orders

## 2013-06-09 NOTE — Patient Instructions (Signed)
Urinary Tract Infection  Urinary tract infections (UTIs) can develop anywhere along your urinary tract. Your urinary tract is your body's drainage system for removing wastes and extra water. Your urinary tract includes two kidneys, two ureters, a bladder, and a urethra. Your kidneys are a pair of bean-shaped organs. Each kidney is about the size of your fist. They are located below your ribs, one on each side of your spine.  CAUSES  Infections are caused by microbes, which are microscopic organisms, including fungi, viruses, and bacteria. These organisms are so small that they can only be seen through a microscope. Bacteria are the microbes that most commonly cause UTIs.  SYMPTOMS   Symptoms of UTIs may vary by age and gender of the patient and by the location of the infection. Symptoms in young women typically include a frequent and intense urge to urinate and a painful, burning feeling in the bladder or urethra during urination. Older women and men are more likely to be tired, shaky, and weak and have muscle aches and abdominal pain. A fever may mean the infection is in your kidneys. Other symptoms of a kidney infection include pain in your back or sides below the ribs, nausea, and vomiting.  DIAGNOSIS  To diagnose a UTI, your caregiver will ask you about your symptoms. Your caregiver also will ask to provide a urine sample. The urine sample will be tested for bacteria and white blood cells. White blood cells are made by your body to help fight infection.  TREATMENT   Typically, UTIs can be treated with medication. Because most UTIs are caused by a bacterial infection, they usually can be treated with the use of antibiotics. The choice of antibiotic and length of treatment depend on your symptoms and the type of bacteria causing your infection.  HOME CARE INSTRUCTIONS   If you were prescribed antibiotics, take them exactly as your caregiver instructs you. Finish the medication even if you feel better after you  have only taken some of the medication.   Drink enough water and fluids to keep your urine clear or pale yellow.   Avoid caffeine, tea, and carbonated beverages. They tend to irritate your bladder.   Empty your bladder often. Avoid holding urine for long periods of time.   Empty your bladder before and after sexual intercourse.   After a bowel movement, women should cleanse from front to back. Use each tissue only once.  SEEK MEDICAL CARE IF:    You have back pain.   You develop a fever.   Your symptoms do not begin to resolve within 3 days.  SEEK IMMEDIATE MEDICAL CARE IF:    You have severe back pain or lower abdominal pain.   You develop chills.   You have nausea or vomiting.   You have continued burning or discomfort with urination.  MAKE SURE YOU:    Understand these instructions.   Will watch your condition.   Will get help right away if you are not doing well or get worse.  Document Released: 02/07/2005 Document Revised: 10/30/2011 Document Reviewed: 06/08/2011  [employer] Patient Information 2014 [employer].

## 2013-06-09 NOTE — Addendum Note (Signed)
Addended by: Lurlean Nanny on: 06/09/2013 03:40 PM   Modules accepted: Orders

## 2013-06-11 ENCOUNTER — Other Ambulatory Visit: Payer: Self-pay | Admitting: Internal Medicine

## 2013-06-11 LAB — URINE CULTURE: Colony Count: >=100000

## 2013-06-11 MED ORDER — NITROFURANTOIN MONOHYD MACRO 100 MG PO CAPS: 100.0000 mg | ORAL_CAPSULE | Freq: Two times a day (BID) | ORAL | Status: DC

## 2013-06-12 ENCOUNTER — Telehealth: Payer: Self-pay | Admitting: Internal Medicine

## 2013-06-12 NOTE — Telephone Encounter (Signed)
Patient Information:  Caller Name: Micheala  Phone: [phone]  Patient: Katelyn Knight  Gender: Female  DOB: 06-16-1945  Age: 68 Years  PCP: Ronette Deter (Adults only)  Office Follow Up:  Does the office need to follow up with this patient?: Yes  Instructions For The Office: she has appt at 08;45 Monday morning for evaluation.  PLEASE HAVE PHYSICIAN REVIEW- POSSIBLE BLADDER PROLAPSE. Please contact patient.  RN Note:  she has appt at 08;45 Monday morning for evaluation.  PLEASE HAVE PHYSICIAN REVIEW- POSSIBLE BLADDER PROLAPSE. Please contact patient.  Symptoms  Reason For Call & Symptoms: Patient states she has had UTI for a week. She had Cipro left over from UTI in July and took it but it did not help. she was seen at [facility] office  06/09/13 and given another Rx for Cipro. Culture came back yesterday 06/11/13 , +ecoli. Changed the antibiotic- last night Macrobid.  She reports  "a ball  (1-2 inches)  at the opening of  vaginal area, she can push it back in the vagina". It is not protruding. Noted for 2-3 days. no worsening.  No fever , some lower back discomfort at waist line, lower abdominal pressure.  She is not having difficulty with urination. +sexually active/hysterectomy  Reviewed Health History In EMR: Yes  Reviewed Medications In EMR: Yes  Reviewed Allergies In EMR: Yes  Reviewed Surgeries / Procedures: Yes  Date of Onset of Symptoms: 06/09/2013  Guideline(s) Used:  Urination Pain - Female  Urinalysis Results Follow-Up Call  Disposition Per Guideline:   See Today in Office  Reason For Disposition Reached:   Age > 50 years  Advice Given:  Fluids:   Drink extra fluids. Drink 8-10 glasses of liquids a day (Reason: to produce a dilute, non-irritating urine).  Call Back If:  You become worse.  RN Overrode Recommendation:  Document Patient  she has appt at 08;45 Monday morning for evaluation.  PLEASE HAVE PHYSICIAN REVIEW- POSSIBLE BLADDER PROLAPSE. Please contact  patient.

## 2013-06-12 NOTE — Telephone Encounter (Signed)
Her history does certainly sound like she hasdfeveloped a cystocele.  This is not an emergency.  It does require a referral to a gynecologist or  Urogynecologist. If she has an appt o nmOnday she soul keep that bc she weill need a pelvic exam to confirm

## 2013-06-12 NOTE — Telephone Encounter (Signed)
Please see message. °

## 2013-06-12 NOTE — Telephone Encounter (Signed)
Pt notified and verbalized understanding.

## 2013-06-15 ENCOUNTER — Encounter: Payer: Self-pay | Admitting: Adult Health

## 2013-06-15 ENCOUNTER — Ambulatory Visit (INDEPENDENT_AMBULATORY_CARE_PROVIDER_SITE_OTHER): Payer: Medicare Other | Admitting: Adult Health

## 2013-06-15 VITALS — BP 100/58 | HR 62 | Resp 12 | Wt 148.5 lb

## 2013-06-15 DIAGNOSIS — N8111 Cystocele, midline: Secondary | ICD-10-CM

## 2013-06-15 DIAGNOSIS — N8110 Cystocele, unspecified: Secondary | ICD-10-CM

## 2013-06-15 DIAGNOSIS — N819 Female genital prolapse, unspecified: Secondary | ICD-10-CM | POA: Insufficient documentation

## 2013-06-15 NOTE — Progress Notes (Signed)
Pre visit review using our clinic review tool, if applicable. No additional management support is needed unless otherwise documented below in the visit note. 

## 2013-06-15 NOTE — Assessment & Plan Note (Signed)
Pt presents with sensation of bulge in vagina. Increasing incontinence. Pt is s/p hysterectomy. No estrogen therapy 2/2 hx of breast cancer. Very active female. ?Cystocele.  Refer to [facility]ro[facility]ne[facility]

## 2013-06-15 NOTE — Progress Notes (Signed)
   Subjective:    Patient ID: Katelyn Knight, female    DOB: 1946-01-19, 68 y.o.   MRN: [id number]  HPI  Pt is a 68 y/o female who presents to clinic with concerns of feeling "a bulge" at the opening of her vagina. She reports first noticing this ~ 1 week ago. She has a hx of breast cancer, hysterectomy. She reports being very active, lifting, bending and doing anything that she needs to do. He has been experiencing incontinence - not able to control urine "at all".   .   Past Medical History  Diagnosis Date  . Breast cancer     Dr Jana Hakim DX 2007  . Hyperlipidemia   . HTN (hypertension)   . Osteoporosis   . URI (upper respiratory infection)   . Menopausal symptom   . Shingles 2014     Past Surgical History  Procedure Laterality Date  . Abdominal hysterectomy    . Breast lumpectomy  01/2006    Dx Breast Cancer  . Appendectomy      At 68 years old  . Bunionectomy    . Abdominal hysterectomy       No family history on file.   History   Social History  . Marital Status: Married    Spouse Name: N/A    Number of Children: 2  . Years of Education: N/A   Occupational History  . 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 Smoking status: Never Smoker   . Smokeless tobacco: Never Used  . Alcohol Use: Yes     Comment: Occasional  . Drug Use: No  . Sexual Activity: Not on file   Other Topics Concern  . Not on file   Social History Narrative   Regular Exercise -  Not at this time   Daily Caffeine Use:  Diet Dr Malachi Bonds x1, Coffee x 1 cup    Review of Systems  Constitutional: Negative.   Respiratory: Negative.   Cardiovascular: Negative.   Genitourinary: Negative for pelvic pain.       Urinary incontinence. Recently has had trouble with UTIs. Feels bulge in vagina.  Musculoskeletal: Positive for back pain.       Reports experiencing back pain since having these symptoms - UTIs, incontinence, bulging sensation  Neurological: Negative.     Psychiatric/Behavioral: Negative.   All other systems reviewed and are negative.       Objective:   Physical Exam  Constitutional: She is oriented to person, place, and time. She appears well-developed and well-nourished. No distress.  Cardiovascular: Normal rate and regular rhythm.   Pulmonary/Chest: Effort normal. No respiratory distress.  Abdominal: Soft. She exhibits no distension. There is no tenderness.  Genitourinary: Vagina normal. Rectal exam shows external hemorrhoid. No labial fusion. There is no rash, tenderness, lesion or injury on the right labia. There is no tenderness, lesion or injury on the left labia. No erythema, tenderness or bleeding around the vagina. No foreign body around the vagina. No signs of injury around the vagina. No vaginal discharge found.  Neurological: She is alert and oriented to person, place, and time.  Skin: Skin is warm and dry.  Psychiatric: She has a normal mood and affect. Her behavior is normal. Judgment and thought content normal.    BP 100/58  Pulse 62  Resp 12  Wt 148 lb 8 oz (67.359 kg)  SpO2 96%       Assessment & Plan:

## 2013-07-02 DIAGNOSIS — N8110 Cystocele, unspecified: Secondary | ICD-10-CM | POA: Insufficient documentation

## 2013-07-02 DIAGNOSIS — N952 Postmenopausal atrophic vaginitis: Secondary | ICD-10-CM | POA: Insufficient documentation

## 2013-07-07 ENCOUNTER — Encounter: Payer: Self-pay | Admitting: Internal Medicine

## 2013-07-08 MED ORDER — FENOFIBRATE MICRONIZED 134 MG PO CAPS: 134.0000 mg | ORAL_CAPSULE | Freq: Every day | ORAL | Status: DC

## 2013-07-08 MED ORDER — FLUOXETINE HCL 20 MG PO CAPS: 20.0000 mg | ORAL_CAPSULE | Freq: Every day | ORAL | Status: DC

## 2013-08-11 ENCOUNTER — Telehealth: Payer: Self-pay | Admitting: *Deleted

## 2013-08-11 NOTE — Telephone Encounter (Signed)
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 Patient stated, " I want my oncologists opinion before I start this cream." Return number is [phone]. I tried calling patient back and phone rang. No way to leave a message.

## 2013-08-13 ENCOUNTER — Other Ambulatory Visit: Payer: Self-pay | Admitting: Oncology

## 2013-08-13 NOTE — Progress Notes (Unsigned)
Katelyn Knight is working with Louellen Molder at [facility] because of problems with prolapsed bladder and uterus. They would like to try an estrogen cream. I explained to [facility] we simply do not have data to show that this is safe and we also do not have data to show that it is unsafe. She understands that she will absorb a little bit of estrogen.  In patients taking tamoxifen I generally don't worry about that little bit of estrogen, and she certainly could be a candidate for another 5 years of tamoxifen but she absolutely hates the idea of doing that.  Accordingly this becomes a quality of life issue. She is going to give it a try and if it works in it looks like he is going to need immediate long-term she will call me back and we will reconsider the tamoxifen option at that point.

## 2013-08-20 ENCOUNTER — Encounter: Payer: Medicare Other | Admitting: Internal Medicine

## 2013-08-20 ENCOUNTER — Other Ambulatory Visit: Payer: Self-pay | Admitting: Internal Medicine

## 2013-08-20 DIAGNOSIS — Z9889 Other specified postprocedural states: Secondary | ICD-10-CM

## 2013-08-20 DIAGNOSIS — Z853 Personal history of malignant neoplasm of breast: Secondary | ICD-10-CM

## 2013-08-20 DIAGNOSIS — N6320 Unspecified lump in the left breast, unspecified quadrant: Secondary | ICD-10-CM

## 2013-08-25 ENCOUNTER — Other Ambulatory Visit: Payer: Self-pay

## 2013-08-25 ENCOUNTER — Other Ambulatory Visit: Payer: Self-pay | Admitting: Internal Medicine

## 2013-08-25 DIAGNOSIS — Z853 Personal history of malignant neoplasm of breast: Secondary | ICD-10-CM

## 2013-08-25 DIAGNOSIS — Z9889 Other specified postprocedural states: Secondary | ICD-10-CM

## 2013-08-25 DIAGNOSIS — N6320 Unspecified lump in the left breast, unspecified quadrant: Secondary | ICD-10-CM

## 2013-08-31 ENCOUNTER — Other Ambulatory Visit: Payer: Medicare Other

## 2013-09-02 ENCOUNTER — Encounter: Payer: Medicare Other | Admitting: Internal Medicine

## 2013-09-02 DIAGNOSIS — Z960 Presence of urogenital implants: Secondary | ICD-10-CM | POA: Insufficient documentation

## 2013-09-15 ENCOUNTER — Encounter: Payer: Self-pay | Admitting: Internal Medicine

## 2013-09-15 ENCOUNTER — Ambulatory Visit (INDEPENDENT_AMBULATORY_CARE_PROVIDER_SITE_OTHER): Payer: Medicare Other | Admitting: Internal Medicine

## 2013-09-15 ENCOUNTER — Other Ambulatory Visit: Payer: Self-pay | Admitting: *Deleted

## 2013-09-15 VITALS — BP 108/60 | HR 57 | Temp 97.8°F | Resp 16 | Ht 63.25 in | Wt 145.0 lb

## 2013-09-15 DIAGNOSIS — Z0000 Encounter for general adult medical examination without abnormal findings: Secondary | ICD-10-CM

## 2013-09-15 DIAGNOSIS — G589 Mononeuropathy, unspecified: Secondary | ICD-10-CM

## 2013-09-15 DIAGNOSIS — G629 Polyneuropathy, unspecified: Secondary | ICD-10-CM

## 2013-09-15 DIAGNOSIS — N390 Urinary tract infection, site not specified: Secondary | ICD-10-CM

## 2013-09-15 DIAGNOSIS — Z136 Encounter for screening for cardiovascular disorders: Secondary | ICD-10-CM

## 2013-09-15 LAB — POCT URINALYSIS DIPSTICK
Bilirubin, UA: NEGATIVE
Glucose, UA: NEGATIVE
Nitrite, UA: NEGATIVE
Protein, UA: 30
Spec Grav, UA: 1.025
Urobilinogen, UA: 0.2
pH, UA: 7

## 2013-09-15 LAB — COMPREHENSIVE METABOLIC PANEL
ALT: 20 U/L (ref 0–35)
AST: 38 U/L — ABNORMAL HIGH (ref 0–37)
Albumin: 4.3 g/dL (ref 3.5–5.2)
Alkaline Phosphatase: 49 U/L (ref 39–117)
BUN: 17 mg/dL (ref 6–23)
CO2: 30 mEq/L (ref 19–32)
Calcium: 9.8 mg/dL (ref 8.4–10.5)
Chloride: 104 mEq/L (ref 96–112)
Creatinine, Ser: 1 mg/dL (ref 0.4–1.2)
GFR: 61.05 mL/min (ref >60.00)
Glucose, Bld: 81 mg/dL (ref 70–99)
Potassium: 4.1 mEq/L (ref 3.5–5.1)
Sodium: 141 mEq/L (ref 135–145)
Total Bilirubin: 0.6 mg/dL (ref 0.2–1.2)
Total Protein: 6.5 g/dL (ref 6.0–8.3)

## 2013-09-15 LAB — CBC WITH DIFFERENTIAL/PLATELET
Basophils Absolute: 0 10*3/uL (ref 0.0–0.1)
Basophils Relative: 0.4 % (ref 0.0–3.0)
EOS ABS: 0 10*3/uL (ref 0.0–0.7)
EOS PCT: 0.2 % (ref 0.0–5.0)
HEMATOCRIT: 37.9 % (ref 36.0–46.0)
Hemoglobin: 12.8 g/dL (ref 12.0–15.0)
LYMPHS ABS: 1 10*3/uL (ref 0.7–4.0)
Lymphocytes Relative: 22.3 % (ref 12.0–46.0)
MCHC: 33.7 g/dL (ref 30.0–36.0)
MCV: 92.6 fl (ref 78.0–100.0)
MONO ABS: 0.5 10*3/uL (ref 0.1–1.0)
Monocytes Relative: 10.4 % (ref 3.0–12.0)
NEUTROS PCT: 66.7 % (ref 43.0–77.0)
Neutro Abs: 3.1 10*3/uL (ref 1.4–7.7)
PLATELETS: 202 10*3/uL (ref 150.0–400.0)
RBC: 4.09 Mil/uL (ref 3.87–5.11)
RDW: 13.3 % (ref 11.5–15.5)
WBC: 4.6 10*3/uL (ref 4.0–10.5)

## 2013-09-15 LAB — MICROALBUMIN / CREATININE URINE RATIO
Creatinine,U: 225.7 mg/dL
Microalb Creat Ratio: 3.7 mg/g (ref 0.0–30.0)
Microalb, Ur: 8.4 mg/dL — ABNORMAL HIGH (ref 0.0–1.9)

## 2013-09-15 LAB — LIPID PANEL
Cholesterol: 186 mg/dL (ref 0–200)
HDL: 53.8 mg/dL (ref >39.00)
LDL Cholesterol: 116 mg/dL — ABNORMAL HIGH (ref 0–99)
Total CHOL/HDL Ratio: 3
Triglycerides: 80 mg/dL (ref 0.0–149.0)
VLDL: 16 mg/dL (ref 0.0–40.0)

## 2013-09-15 MED ORDER — GABAPENTIN 300 MG PO CAPS: 300.0000 mg | ORAL_CAPSULE | Freq: Every day | ORAL | Status: DC

## 2013-09-15 MED ORDER — FENOFIBRATE MICRONIZED 134 MG PO CAPS: 134.0000 mg | ORAL_CAPSULE | Freq: Every day | ORAL | Status: DC

## 2013-09-15 MED ORDER — FLUOXETINE HCL 20 MG PO CAPS: 20.0000 mg | ORAL_CAPSULE | Freq: Every day | ORAL | Status: DC

## 2013-09-15 MED ORDER — LOSARTAN POTASSIUM-HCTZ 100-12.5 MG PO TABS: 0.5000 | ORAL_TABLET | Freq: Every day | ORAL | Status: DC

## 2013-09-15 MED ORDER — CIPROFLOXACIN HCL 500 MG PO TABS: 500.0000 mg | ORAL_TABLET | Freq: Two times a day (BID) | ORAL | Status: DC

## 2013-09-15 NOTE — Progress Notes (Signed)
Subjective:    Patient ID: Katelyn Knight, female    DOB: Nov 19, 1945, 68 y.o.   MRN: [id number]  HPI The patient is here for annual Medicare wellness examination and management of other chronic and acute problems.   The risk factors are reflected in the social history.  The roster of all physicians providing medical care to patient - is listed in the Snapshot section of the chart.  Activities of daily living:  The patient is 100% independent in all ADLs: dressing, toileting, feeding as well as independent mobility. Lives in [place] in home. Single story.  Home safety : The patient has smoke detectors in the home. They wear seatbelts.  There are no firearms at home. There is no violence in the home.   There is no risks for hepatitis, STDs or HIV. There is no history of blood transfusion. They have no travel history to infectious disease endemic areas of the world.  The patient has seen their dentist in the last six month.  (Dr. Sharlett Iles) They have seen their eye doctor in the last year. (Dr. Quay Burow [facility]) No issues with hearing They have deferred audiologic testing in the last year.  They do not  have excessive sun exposure.  Urogynecologist - Dr. Stephens November. Started using a pessary this year, with improvement in symptoms of back pain.  Discussed the need for sun protection: hats, long sleeves and use of sunscreen if there is significant sun exposure.   Diet: the importance of a healthy diet is discussed. They do have a healthy diet.  The benefits of regular aerobic exercise were discussed. She goes to gym with friend. She is active gardening.  Depression screen: there are no signs or vegative symptoms of depression- irritability, change in appetite, anhedonia, sadness/tearfullness.  Cognitive assessment: the patient manages all their financial and personal affairs and is actively engaged. They could relate day,date,year and events.  The following portions of the  patient's history were reviewed and updated as appropriate: allergies, current medications, past family history, past medical history,  past surgical history, past social history  and problem list.  HCPOA - none at present  Visual acuity was not assessed per patient preference since she has regular follow up with her ophthalmologist. Hearing and body mass index were assessed and reviewed.   During the course of the visit the patient was educated and counseled about appropriate screening and preventive services including : fall prevention , diabetes screening, nutrition counseling, colorectal cancer screening, and recommended immunizations.      Review of Systems  Constitutional: Negative for fever, chills, appetite change, fatigue and unexpected weight change.  HENT: Negative for congestion, ear pain, sinus pressure, sore throat, trouble swallowing and voice change.   Eyes: Negative for visual disturbance.  Respiratory: Negative for cough, shortness of breath, wheezing and stridor.   Cardiovascular: Negative for chest pain, palpitations and leg swelling.  Gastrointestinal: Negative for nausea, vomiting, abdominal pain, diarrhea, constipation, blood in stool, abdominal distention and anal bleeding.  Genitourinary: Positive for urgency. Negative for dysuria and flank pain.  Musculoskeletal: Negative for arthralgias, gait problem, myalgias and neck pain.  Skin: Negative for color change and rash.  Neurological: Negative for dizziness and headaches.  Hematological: Negative for adenopathy. Does not bruise/bleed easily.  Psychiatric/Behavioral: Negative for suicidal ideas, sleep disturbance and dysphoric mood. The patient is not nervous/anxious.    BP 108/60  Pulse 57  Temp(Src) 97.8 F (36.6 C) (Oral)  Resp 16  Ht 5' 3.25" (1.607  m)  Wt 145 lb (65.772 kg)  BMI 25.47 kg/m2  SpO2 97%     Objective:   Physical Exam  Constitutional: She is oriented to person, place, and time. She appears  well-developed and well-nourished. No distress.  HENT:  Head: Normocephalic and atraumatic.  Right Ear: External ear normal.  Left Ear: External ear normal.  Nose: Nose normal.  Mouth/Throat: Oropharynx is clear and moist. No oropharyngeal exudate.  Eyes: Conjunctivae are normal. Pupils are equal, round, and reactive to light. Right eye exhibits no discharge. Left eye exhibits no discharge. No scleral icterus.  Neck: Normal range of motion. Neck supple. No tracheal deviation present. No thyromegaly present.  Cardiovascular: Normal rate, regular rhythm, normal heart sounds and intact distal pulses.  Exam reveals no gallop and no friction rub.   No murmur heard. Pulmonary/Chest: Effort normal and breath sounds normal. No accessory muscle usage. Not tachypneic. No respiratory distress. She has no decreased breath sounds. She has no wheezes. She has no rhonchi. She has no rales. She exhibits no tenderness. Right breast exhibits no inverted nipple, no mass, no nipple discharge, no skin change and no tenderness. Left breast exhibits no inverted nipple, no mass, no nipple discharge, no skin change and no tenderness. Breasts are symmetrical.  Abdominal: Soft. Bowel sounds are normal. She exhibits no distension and no mass. There is no tenderness. There is no rebound and no guarding.  Musculoskeletal: Normal range of motion. She exhibits no edema and no tenderness.  Lymphadenopathy:    She has no cervical adenopathy.  Neurological: She is alert and oriented to person, place, and time. No cranial nerve deficit. She exhibits normal muscle tone. Coordination normal.  Skin: Skin is warm and dry. No rash noted. She is not diaphoretic. No erythema. No pallor.  Psychiatric: She has a normal mood and affect. Her behavior is normal. Judgment and thought content normal.          Assessment & Plan:

## 2013-09-15 NOTE — Assessment & Plan Note (Signed)
General medical exam normal today including breast exam. PAP and pelvic deferred given pt s/p hysterectomy and per pt preference. Mammogram UTD. Colonoscopy declined. Will check labs including CBC, CMP, lipids. Immunizations are UTD. Discussed getting Prevar in 02/2014. Encouraged healthy diet and exercise.

## 2013-09-15 NOTE — Progress Notes (Signed)
Pre visit review using our clinic review tool, if applicable. No additional management support is needed unless otherwise documented below in the visit note. 

## 2013-09-16 LAB — VITAMIN D 25 HYDROXY (VIT D DEFICIENCY, FRACTURES): Vit D, 25-Hydroxy: 38 ng/mL (ref 30–89)

## 2013-09-18 LAB — URINE CULTURE

## 2013-09-18 MED ORDER — SULFAMETHOXAZOLE-TMP DS 800-160 MG PO TABS: 1.0000 | ORAL_TABLET | Freq: Two times a day (BID) | ORAL | Status: DC

## 2013-09-18 NOTE — Addendum Note (Signed)
Addended by: Ronette Deter A on: 09/18/2013 08:16 PM   Modules accepted: Orders

## 2014-02-11 ENCOUNTER — Ambulatory Visit
Admission: RE | Admit: 2014-02-11 | Discharge: 2014-02-11 | Disposition: A | Discharge status: Home / Self Care | Payer: Medicare Other | Source: Ambulatory Visit | Attending: Internal Medicine | Admitting: Internal Medicine

## 2014-02-11 DIAGNOSIS — N6320 Unspecified lump in the left breast, unspecified quadrant: Secondary | ICD-10-CM

## 2014-02-11 DIAGNOSIS — Z853 Personal history of malignant neoplasm of breast: Secondary | ICD-10-CM

## 2014-02-11 DIAGNOSIS — Z9889 Other specified postprocedural states: Secondary | ICD-10-CM

## 2014-02-11 LAB — HM MAMMOGRAPHY: HM Mammogram: NORMAL

## 2014-07-01 ENCOUNTER — Encounter: Payer: Self-pay | Admitting: Internal Medicine

## 2014-07-01 ENCOUNTER — Ambulatory Visit (INDEPENDENT_AMBULATORY_CARE_PROVIDER_SITE_OTHER): Payer: Medicare Other | Admitting: Internal Medicine

## 2014-07-01 VITALS — BP 147/80 | HR 53 | Temp 97.5°F | Ht 63.25 in | Wt 152.0 lb

## 2014-07-01 DIAGNOSIS — F329 Major depressive disorder, single episode, unspecified: Secondary | ICD-10-CM | POA: Diagnosis not present

## 2014-07-01 DIAGNOSIS — Z23 Encounter for immunization: Secondary | ICD-10-CM

## 2014-07-01 DIAGNOSIS — G629 Polyneuropathy, unspecified: Secondary | ICD-10-CM | POA: Diagnosis not present

## 2014-07-01 DIAGNOSIS — N952 Postmenopausal atrophic vaginitis: Secondary | ICD-10-CM

## 2014-07-01 DIAGNOSIS — F32A Depression, unspecified: Secondary | ICD-10-CM

## 2014-07-01 DIAGNOSIS — I10 Essential (primary) hypertension: Secondary | ICD-10-CM

## 2014-07-01 LAB — COMPREHENSIVE METABOLIC PANEL
ALBUMIN: 4.2 g/dL (ref 3.5–5.2)
ALT: 14 U/L (ref 0–35)
AST: 30 U/L (ref 0–37)
Alkaline Phosphatase: 53 U/L (ref 39–117)
BILIRUBIN TOTAL: 0.5 mg/dL (ref 0.2–1.2)
BUN: 15 mg/dL (ref 6–23)
CO2: 29 mEq/L (ref 19–32)
CREATININE: 1.11 mg/dL (ref 0.40–1.20)
Calcium: 9.7 mg/dL (ref 8.4–10.5)
Chloride: 101 mEq/L (ref 96–112)
GFR: 51.52 mL/min — ABNORMAL LOW (ref >60.00)
GLUCOSE: 91 mg/dL (ref 70–99)
Potassium: 4.2 mEq/L (ref 3.5–5.1)
Sodium: 136 mEq/L (ref 135–145)
Total Protein: 7 g/dL (ref 6.0–8.3)

## 2014-07-01 MED ORDER — FLUOXETINE HCL 20 MG PO CAPS: 20.0000 mg | ORAL_CAPSULE | Freq: Every day | ORAL | Status: DC

## 2014-07-01 MED ORDER — ESTROGENS, CONJUGATED 0.625 MG/GM VA CREA: 1.0000 | TOPICAL_CREAM | VAGINAL | Status: DC

## 2014-07-01 MED ORDER — GABAPENTIN 300 MG PO CAPS: 300.0000 mg | ORAL_CAPSULE | Freq: Every day | ORAL | Status: DC

## 2014-07-01 MED ORDER — LOSARTAN POTASSIUM-HCTZ 100-12.5 MG PO TABS: 1.0000 | ORAL_TABLET | Freq: Every day | ORAL | Status: DC

## 2014-07-01 NOTE — Assessment & Plan Note (Signed)
Symptoms well controlled with topical Premarin. Will continue.

## 2014-07-01 NOTE — Progress Notes (Signed)
Subjective:    Patient ID: Katelyn Knight, female    DOB: [birth year], 69 y.o.   MRN: [id number]  HPI  69YO female presents for follow up.  Planning to get refills on Premarin from [place]. Unable to afford medication through the [place]. Uses medication with improvement in vaginal dryness.  Difficult year for her. Mother has been ill and husband was diagnosed with colon cancer. Feels that Fluoxetine has helped control symptoms of anxiety and depression.  Generally feeling well. Notes BP has been higher recently, with some weight gain. Exercising less over winter months. Plans to increase Losartan HCTZ to full 1 tablet daily instead of half tablet for now. No chest pain, headache.  Past medical, surgical, family and social history per today's encounter.  Wt Readings from Last 3 Encounters:  07/01/14 152 lb (68.947 kg)  09/15/13 145 lb (65.772 kg)  06/15/13 148 lb 8 oz (67.359 kg)    Review of Systems  Constitutional: Negative for fever, chills, appetite change, fatigue and unexpected weight change.  Eyes: Negative for visual disturbance.  Respiratory: Negative for shortness of breath.   Cardiovascular: Negative for chest pain and leg swelling.  Gastrointestinal: Negative for abdominal pain, diarrhea and constipation.  Skin: Negative for color change and rash.  Neurological: Negative for headaches.  Hematological: Negative for adenopathy. Does not bruise/bleed easily.  Psychiatric/Behavioral: Negative for dysphoric mood. The patient is not nervous/anxious.        Objective:    BP 147/80 mmHg  Pulse 53  Temp(Src) 97.5 F (36.4 C) (Oral)  Ht 5' 3.25" (1.607 m)  Wt 152 lb (68.947 kg)  BMI 26.70 kg/m2  SpO2 99% Physical Exam  Constitutional: She is oriented to person, place, and time. She appears well-developed and well-nourished. No distress.  HENT:  Head: Normocephalic and atraumatic.  Right Ear: External ear normal.  Left Ear: External ear normal.  Nose: Nose normal.    Mouth/Throat: Oropharynx is clear and moist. No oropharyngeal exudate.  Eyes: Conjunctivae are normal. Pupils are equal, round, and reactive to light. Right eye exhibits no discharge. Left eye exhibits no discharge. No scleral icterus.  Neck: Normal range of motion. Neck supple. No tracheal deviation present. No thyromegaly present.  Cardiovascular: Normal rate, regular rhythm, normal heart sounds and intact distal pulses.  Exam reveals no gallop and no friction rub.   No murmur heard. Pulmonary/Chest: Effort normal and breath sounds normal. No respiratory distress. She has no wheezes. She has no rales. She exhibits no tenderness.  Musculoskeletal: Normal range of motion. She exhibits no edema or tenderness.  Lymphadenopathy:    She has no cervical adenopathy.  Neurological: She is alert and oriented to person, place, and time. No cranial nerve deficit. She exhibits normal muscle tone. Coordination normal.  Skin: Skin is warm and dry. No rash noted. She is not diaphoretic. No erythema. No pallor.  Psychiatric: She has a normal mood and affect. Her behavior is normal. Judgment and thought content normal.          Assessment & Plan:   Problem List Items Addressed This Visit      Unprioritized   Atrophy of vagina    Symptoms well controlled with topical Premarin. Will continue.      Depression    Symptoms well controlled with Fluoxetine. Will continue.      Relevant Medications   FLUoxetine (PROZAC) capsule   Hypertension - Primary    BP Readings from Last 3 Encounters:  07/01/14 147/80  09/15/13 108/60  06/15/13 100/58   BP elevated today and at home when pt checks. Will increase Losartan-HCTZ to full tablet daily. Will check renal function with labs.      Relevant Medications   losartan-hydrochlorothiazide (HYZAAR) 100-12.5 MG per tablet   Other Relevant Orders   Comprehensive metabolic panel   Neuropathy    Symptoms well controlled with neurontin. Will continue.       Relevant Medications   gabapentin (NEURONTIN) capsule    Other Visit Diagnoses    Need for vaccination with 13-polyvalent pneumococcal conjugate vaccine        Relevant Orders    Pneumococcal conjugate vaccine 13-valent (Completed)        Return in about 6 months (around 12/30/2014) for Wellness Visit.

## 2014-07-01 NOTE — Progress Notes (Signed)
Pre visit review using our clinic review tool, if applicable. No additional management support is needed unless otherwise documented below in the visit note. 

## 2014-07-01 NOTE — Assessment & Plan Note (Signed)
Symptoms well controlled with neurontin. Will continue.

## 2014-07-01 NOTE — Assessment & Plan Note (Signed)
Symptoms well controlled with Fluoxetine. Will continue.

## 2014-07-01 NOTE — Patient Instructions (Signed)
Labs today.  Follow up in 6 months. 

## 2014-07-01 NOTE — Assessment & Plan Note (Signed)
BP Readings from Last 3 Encounters:  07/01/14 147/80  09/15/13 108/60  06/15/13 100/58   BP elevated today and at home when pt checks. Will increase Losartan-HCTZ to full tablet daily. Will check renal function with labs.

## 2014-07-03 ENCOUNTER — Telehealth: Payer: Self-pay | Admitting: Internal Medicine

## 2014-07-03 NOTE — Telephone Encounter (Signed)
emmi emailed °

## 2014-09-30 ENCOUNTER — Other Ambulatory Visit: Payer: Self-pay | Admitting: Internal Medicine

## 2014-12-30 ENCOUNTER — Encounter: Payer: Self-pay | Admitting: Internal Medicine

## 2014-12-30 ENCOUNTER — Ambulatory Visit (INDEPENDENT_AMBULATORY_CARE_PROVIDER_SITE_OTHER): Payer: Medicare Other | Admitting: Internal Medicine

## 2014-12-30 VITALS — BP 106/64 | HR 54 | Temp 98.0°F | Ht 63.5 in | Wt 152.2 lb

## 2014-12-30 DIAGNOSIS — Z0000 Encounter for general adult medical examination without abnormal findings: Secondary | ICD-10-CM | POA: Diagnosis not present

## 2014-12-30 DIAGNOSIS — E785 Hyperlipidemia, unspecified: Secondary | ICD-10-CM

## 2014-12-30 DIAGNOSIS — Z0001 Encounter for general adult medical examination with abnormal findings: Secondary | ICD-10-CM | POA: Insufficient documentation

## 2014-12-30 LAB — COMPREHENSIVE METABOLIC PANEL
ALBUMIN: 4.2 g/dL (ref 3.5–5.2)
ALK PHOS: 47 U/L (ref 39–117)
ALT: 17 U/L (ref 0–35)
AST: 38 U/L — AB (ref 0–37)
BUN: 15 mg/dL (ref 6–23)
CO2: 29 mEq/L (ref 19–32)
CREATININE: 1.01 mg/dL (ref 0.40–1.20)
Calcium: 9.5 mg/dL (ref 8.4–10.5)
Chloride: 100 mEq/L (ref 96–112)
GFR: 57.37 mL/min — ABNORMAL LOW (ref >60.00)
Glucose, Bld: 89 mg/dL (ref 70–99)
POTASSIUM: 3.9 meq/L (ref 3.5–5.1)
SODIUM: 134 meq/L — AB (ref 135–145)
TOTAL PROTEIN: 6.8 g/dL (ref 6.0–8.3)
Total Bilirubin: 0.5 mg/dL (ref 0.2–1.2)

## 2014-12-30 LAB — LIPID PANEL
Cholesterol: 187 mg/dL (ref 0–200)
HDL: 59.8 mg/dL (ref >39.00)
LDL Cholesterol: 114 mg/dL — ABNORMAL HIGH (ref 0–99)
NonHDL: 127.48
Total CHOL/HDL Ratio: 3
Triglycerides: 67 mg/dL (ref 0.0–149.0)
VLDL: 13.4 mg/dL (ref 0.0–40.0)

## 2014-12-30 NOTE — Assessment & Plan Note (Signed)
General medical exam normal today including breast exam. PAP and pelvic deferred given pt s/p hysterectomy and per pt preference. Mammogram UTD. Colonoscopy declined. However, will set up Cologuard testing. Will check labs including CMP, lipids. Immunizations are UTD. Encouraged healthy diet and exercise.

## 2014-12-30 NOTE — Patient Instructions (Signed)

## 2014-12-30 NOTE — Assessment & Plan Note (Signed)
General physical exam including breast exam normal today. Pelvic and PAP deferred as s/p hysterectomy. Mammogram UTD. Labs as ordered. Encouraged healthy diet and exercise.

## 2014-12-30 NOTE — Progress Notes (Addendum)
Subjective:    Patient ID: Katelyn Knight, female    DOB: Sep 18, 1945, 69 y.o.   MRN: [id number]  HPI  The patient is here for annual Medicare wellness examination and management of other chronic and acute problems.   The risk factors are reflected in the social history.  The roster of all physicians providing medical care to patient - is listed in the Snapshot section of the chart.  Activities of daily living:  The patient is 100% independent in all ADLs: dressing, toileting, feeding as well as independent mobility. Lives in [place] in home. Single story.  Home safety : The patient has smoke detectors in the home. They wear seatbelts.  There are no firearms at home. There is no violence in the home.   There is no risks for hepatitis, STDs or HIV. There is no history of blood transfusion. They have no travel history to infectious disease endemic areas of the world.  The patient has seen their dentist in the last six month.  (Dr. Sharlett Iles) They have seen their eye doctor in the last year. (Dr. Quay Burow [facility]) No issues with hearing They have deferred audiologic testing in the last year.  They do not  have excessive sun exposure.  Urogynecologist - Dr. Stephens November. Continues to use pessary. Using Premarin, which she gets from [facility] in [place].  Discussed the need for sun protection: hats, long sleeves and use of sunscreen if there is significant sun exposure.   Diet: the importance of a healthy diet is discussed. They do have a healthy diet.  The benefits of regular aerobic exercise were discussed. She goes to gym with friend. She is active gardening.  Depression screen: there are no signs or vegative symptoms of depression- irritability, change in appetite, anhedonia, sadness/tearfullness.  Cognitive assessment: the patient manages all their financial and personal affairs and is actively engaged. They could relate day,date,year and events.  The following portions of  the patient's history were reviewed and updated as appropriate: allergies, current medications, past family history, past medical history,  past surgical history, past social history  and problem list.  HCPOA - none at present  Visual acuity was not assessed per patient preference since she has regular follow up with her ophthalmologist. Hearing and body mass index were assessed and reviewed.   During the course of the visit the patient was educated and counseled about appropriate screening and preventive services including : fall prevention , diabetes screening, nutrition counseling, colorectal cancer screening, and recommended immunizations.      Past Medical History  Diagnosis Date  . Breast cancer     Dr Jana Hakim DX 2007  . Hyperlipidemia   . HTN (hypertension)   . Osteoporosis   . URI (upper respiratory infection)   . Menopausal symptom   . Shingles 2014   No family history on file. Past Surgical History  Procedure Laterality Date  . Abdominal hysterectomy    . Breast lumpectomy  01/2006    Dx Breast Cancer  . Appendectomy      At 69 years old  . Bunionectomy    . Abdominal hysterectomy     Social History   Social History  . Marital Status: Married    Spouse Name: N/A  . Number of Children: 2  . Years of Education: N/A   Occupational History  . 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 Smoking status: Never Smoker   . Smokeless tobacco: Never Used  .  Alcohol Use: Yes     Comment: Occasional  . Drug Use: No  . Sexual Activity: Not Asked   Other Topics Concern  . None   Social History Narrative   Regular Exercise -  Not at this time   Daily Caffeine Use:  Diet Dr Malachi Bonds x1, Coffee x 1 cup    Review of Systems  Constitutional: Negative for fever, chills, appetite change, fatigue and unexpected weight change.  HENT: Negative for congestion, sinus pressure, sore throat, trouble swallowing and voice change.   Eyes: Negative for visual  disturbance.  Respiratory: Negative for shortness of breath.   Cardiovascular: Negative for chest pain and leg swelling.  Gastrointestinal: Negative for nausea, vomiting, abdominal pain, diarrhea and constipation.  Musculoskeletal: Negative for myalgias and arthralgias.  Skin: Negative for color change and rash.  Neurological: Negative for weakness and numbness.  Hematological: Negative for adenopathy. Does not bruise/bleed easily.  Psychiatric/Behavioral: Negative for suicidal ideas, behavioral problems, sleep disturbance and dysphoric mood. The patient is not nervous/anxious.        Objective:    BP 106/64 mmHg  Pulse 54  Temp(Src) 98 F (36.7 C) (Oral)  Ht 5' 3.5" (1.613 m)  Wt 152 lb 4 oz (69.06 kg)  BMI 26.54 kg/m2  SpO2 97% Physical Exam  Constitutional: She is oriented to person, place, and time. She appears well-developed and well-nourished. No distress.  HENT:  Head: Normocephalic and atraumatic.  Right Ear: External ear normal.  Left Ear: External ear normal.  Nose: Nose normal.  Mouth/Throat: Oropharynx is clear and moist. No oropharyngeal exudate.  Eyes: Conjunctivae are normal. Pupils are equal, round, and reactive to light. Right eye exhibits no discharge. Left eye exhibits no discharge. No scleral icterus.  Neck: Normal range of motion. Neck supple. No tracheal deviation present. No thyromegaly present.  Cardiovascular: Normal rate, regular rhythm, normal heart sounds and intact distal pulses.  Exam reveals no gallop and no friction rub.   No murmur heard. Pulmonary/Chest: Effort normal and breath sounds normal. No accessory muscle usage. No tachypnea. No respiratory distress. She has no decreased breath sounds. She has no wheezes. She has no rales. She exhibits no tenderness. Right breast exhibits no inverted nipple, no mass, no nipple discharge, no skin change and no tenderness. Left breast exhibits no inverted nipple, no mass, no nipple discharge, no skin change  and no tenderness. Breasts are symmetrical.  Abdominal: Soft. Bowel sounds are normal. She exhibits no distension and no mass. There is no tenderness. There is no rebound and no guarding.  Musculoskeletal: Normal range of motion. She exhibits no edema or tenderness.  Lymphadenopathy:    She has no cervical adenopathy.  Neurological: She is alert and oriented to person, place, and time. No cranial nerve deficit. She exhibits normal muscle tone. Coordination normal.  Skin: Skin is warm and dry. No rash noted. She is not diaphoretic. No erythema. No pallor.  Psychiatric: She has a normal mood and affect. Her behavior is normal. Judgment and thought content normal.          Assessment & Plan:  Patient was given a handout regarding current recommendations for health maintenance and preventative care on the AVS.  Problem List Items Addressed This Visit      Unprioritized   Hyperlipidemia   Relevant Orders   Comprehensive metabolic panel   Lipid panel   Medicare annual wellness visit, subsequent - Primary    General medical exam normal today including breast exam. PAP and pelvic  deferred given pt s/p hysterectomy and per pt preference. Mammogram UTD. Colonoscopy declined. However, will set up Cologuard testing. Will check labs including CMP, lipids. Immunizations are UTD. Encouraged healthy diet and exercise.         Routine general medical examination at a health care facility    General physical exam including breast exam normal today. Pelvic and PAP deferred as s/p hysterectomy. Mammogram UTD. Labs as ordered. Encouraged healthy diet and exercise.          Return in about 6 months (around 07/02/2015) for Recheck.

## 2014-12-30 NOTE — Progress Notes (Signed)
Pre visit review using our clinic review tool, if applicable. No additional management support is needed unless otherwise documented below in the visit note. 

## 2015-01-10 ENCOUNTER — Other Ambulatory Visit: Payer: Self-pay

## 2015-01-10 DIAGNOSIS — Z853 Personal history of malignant neoplasm of breast: Secondary | ICD-10-CM

## 2015-01-10 DIAGNOSIS — Z9889 Other specified postprocedural states: Secondary | ICD-10-CM

## 2015-01-10 DIAGNOSIS — Z1231 Encounter for screening mammogram for malignant neoplasm of breast: Secondary | ICD-10-CM

## 2015-01-31 DIAGNOSIS — Z1212 Encounter for screening for malignant neoplasm of rectum: Secondary | ICD-10-CM | POA: Diagnosis not present

## 2015-01-31 DIAGNOSIS — Z1211 Encounter for screening for malignant neoplasm of colon: Secondary | ICD-10-CM | POA: Diagnosis not present

## 2015-02-07 ENCOUNTER — Telehealth: Payer: Self-pay | Admitting: Internal Medicine

## 2015-02-07 LAB — COLOGUARD

## 2015-02-07 NOTE — Telephone Encounter (Signed)
Cologuard negative 

## 2015-02-15 ENCOUNTER — Ambulatory Visit
Admission: RE | Admit: 2015-02-15 | Discharge: 2015-02-15 | Disposition: A | Discharge status: Home / Self Care | Payer: Medicare Other | Source: Ambulatory Visit

## 2015-02-15 DIAGNOSIS — Z1231 Encounter for screening mammogram for malignant neoplasm of breast: Secondary | ICD-10-CM | POA: Diagnosis not present

## 2015-02-15 DIAGNOSIS — Z9889 Other specified postprocedural states: Secondary | ICD-10-CM

## 2015-02-15 DIAGNOSIS — Z853 Personal history of malignant neoplasm of breast: Secondary | ICD-10-CM

## 2015-03-03 ENCOUNTER — Encounter: Payer: Self-pay | Admitting: Internal Medicine

## 2015-05-19 ENCOUNTER — Other Ambulatory Visit: Payer: Self-pay | Admitting: Internal Medicine

## 2015-05-23 ENCOUNTER — Ambulatory Visit: Payer: Medicare Other | Admitting: Internal Medicine

## 2015-05-23 ENCOUNTER — Telehealth: Payer: Self-pay

## 2015-05-23 ENCOUNTER — Telehealth: Payer: Self-pay | Admitting: Internal Medicine

## 2015-05-23 MED ORDER — FENOFIBRATE 160 MG PO TABS: 160.0000 mg | ORAL_TABLET | Freq: Every day | ORAL | Status: DC

## 2015-05-23 NOTE — Telephone Encounter (Signed)
Spoke with the patient, advised that prescription was changed.

## 2015-05-23 NOTE — Telephone Encounter (Signed)
Pt has a question about fx for Fenofibrate Micronized. Please contact pt/msn

## 2015-05-23 NOTE — Telephone Encounter (Signed)
Fine to make this change. I have called this in.

## 2015-05-23 NOTE — Telephone Encounter (Signed)
error 

## 2015-05-23 NOTE — Telephone Encounter (Signed)
Spoke with the patient.  She got a call from her insurance.  The Fenofibrate Microinzed and it is a Tier 3 with a Copay of $170.  She is unable to pay for this.  The insurance told her she could have the regular fenofibrate 160mg  drug asa tier 1.  Is this possible to switch?  She stated that if not she will just not take it at all.  Please advise?

## 2015-06-29 ENCOUNTER — Telehealth: Payer: Self-pay | Admitting: Internal Medicine

## 2015-06-29 DIAGNOSIS — G629 Polyneuropathy, unspecified: Secondary | ICD-10-CM

## 2015-06-29 MED ORDER — GABAPENTIN 300 MG PO CAPS: 300.0000 mg | ORAL_CAPSULE | Freq: Every day | ORAL | Status: DC

## 2015-06-29 MED ORDER — FLUOXETINE HCL 20 MG PO CAPS: ORAL_CAPSULE | ORAL | Status: DC

## 2015-06-29 MED ORDER — LOSARTAN POTASSIUM-HCTZ 100-12.5 MG PO TABS: ORAL_TABLET | ORAL | Status: DC

## 2015-06-29 NOTE — Telephone Encounter (Signed)
Caller name: Rayni Hindsman Relationship to patient:  Can be reached: [phone]  Reason for call:  Pt needs refills on Fluoxetine, [place]-hydrochlorothiazide and Gabapentin please send to [facility].

## 2015-12-03 ENCOUNTER — Other Ambulatory Visit: Payer: Self-pay | Admitting: Internal Medicine

## 2016-01-02 ENCOUNTER — Ambulatory Visit: Payer: Self-pay

## 2016-01-06 ENCOUNTER — Encounter: Payer: Self-pay | Admitting: Internal Medicine

## 2016-01-11 ENCOUNTER — Ambulatory Visit (INDEPENDENT_AMBULATORY_CARE_PROVIDER_SITE_OTHER): Payer: Medicare Other | Admitting: Family Medicine

## 2016-01-11 ENCOUNTER — Encounter: Payer: Self-pay | Admitting: Family Medicine

## 2016-01-11 VITALS — BP 126/84 | HR 58 | Temp 97.7°F | Wt 146.2 lb

## 2016-01-11 DIAGNOSIS — Z13220 Encounter for screening for lipoid disorders: Secondary | ICD-10-CM

## 2016-01-11 DIAGNOSIS — Z23 Encounter for immunization: Secondary | ICD-10-CM

## 2016-01-11 DIAGNOSIS — G629 Polyneuropathy, unspecified: Secondary | ICD-10-CM | POA: Diagnosis not present

## 2016-01-11 DIAGNOSIS — Z0000 Encounter for general adult medical examination without abnormal findings: Secondary | ICD-10-CM

## 2016-01-11 MED ORDER — FENOFIBRATE 160 MG PO TABS: 160.0000 mg | ORAL_TABLET | Freq: Every day | ORAL | 3 refills | Status: DC

## 2016-01-11 MED ORDER — GABAPENTIN 300 MG PO CAPS: 300.0000 mg | ORAL_CAPSULE | Freq: Every day | ORAL | 1 refills | Status: DC

## 2016-01-11 MED ORDER — FLUOXETINE HCL 20 MG PO CAPS: 20.0000 mg | ORAL_CAPSULE | Freq: Every day | ORAL | 1 refills | Status: DC

## 2016-01-11 MED ORDER — LOSARTAN POTASSIUM-HCTZ 100-12.5 MG PO TABS: 1.0000 | ORAL_TABLET | Freq: Every day | ORAL | 1 refills | Status: DC

## 2016-01-11 NOTE — Progress Notes (Signed)
Tommi Rumps, MD Phone: [phone]  Katelyn Knight is a 70 y.o. female who presents today for physical exam.  Exercises by doing Silver sneakers 2-3 times a week. Diet described as healthy. Did cologuard 01/31/15 that was negative. Due for mammogram in October. History of hysterectomy with removal of her cervix for noncancerous issue. No Pap smears recently. Unsure of last tetanus vaccination. Declines Zostavax. Has completed her Pneumovax and Prevnar. Flu shot given today. Declines hepatitis C testing. No tobacco use or illicit drug use. Rare alcohol use. Does get postmenopausal hot flashes. Is on gabapentin and Prozac for this. Notes these are beneficial though she is trying to wean off the gabapentin. Taking Hyzaar for blood pressure. Notes her blood pressure is typically good at home.  Active Ambulatory Problems    Diagnosis Date Noted  . Hypertension 05/30/2011  . Hyperlipidemia 05/30/2011  . Neuropathy ([place]) 05/30/2011  . GERD (gastroesophageal reflux disease) 05/30/2011  . Depression 07/11/2011  . Medicare annual wellness visit, subsequent 06/04/2012  . Dupuytren's contracture of both hands 06/04/2012  . 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 Routine general medical examination at a health care facility 12/30/2014   Resolved Ambulatory Problems    Diagnosis Date Noted  . Pain of skin 09/19/2012  . Dysuria 11/20/2012  . Bilateral groin pain 03/03/2013  . Need for prophylactic vaccination and inoculation against influenza 03/03/2013  . Need for prophylactic vaccination against Streptococcus pneumoniae (pneumococcus) 03/03/2013   Past Medical History:  Diagnosis Date  . Breast cancer ([place])   . HTN (hypertension)   . Hyperlipidemia   . Menopausal symptom   . Osteoporosis   . Shingles 2014  . URI (upper respiratory infection)     No family history on file.  Social History    Social History  . Marital status: Married    Spouse name: N/A  . Number of children: 2  . Years of education: N/A   Occupational History  . 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 Smoking status: Never Smoker  . Smokeless tobacco: Never Used  . Alcohol use Yes     Comment: Occasional  . Drug use: No  . Sexual activity: Not on file   Other Topics Concern  . Not on file   Social History Narrative   Regular Exercise -  Not at this time   Daily Caffeine Use:  Diet Dr Malachi Bonds x1, Coffee x 1 cup    ROS  General:  Negative for nexplained weight loss, fever Skin: Negative for new or changing mole, sore that won't heal HEENT: Negative for trouble hearing, trouble seeing, ringing in ears, mouth sores, hoarseness, change in voice, dysphagia. CV:  Negative for chest pain, dyspnea, edema, palpitations Resp: Negative for cough, dyspnea, hemoptysis GI: Negative for nausea, vomiting, diarrhea, constipation, abdominal pain, melena, hematochezia. GU: Negative for dysuria, incontinence, urinary hesitance, hematuria, vaginal or penile discharge, polyuria, sexual difficulty, lumps in testicle or breasts MSK: Negative for muscle cramps or aches, joint pain or swelling Neuro: Negative for headaches, weakness, numbness, dizziness, passing out/fainting Psych: Negative for depression, anxiety, memory problems  Objective  Physical Exam Vitals:   01/11/16 1051  BP: 126/84  Pulse: (!) 58  Temp: 97.7 F (36.5 C)    BP Readings from Last 3 Encounters:  01/11/16 126/84  12/30/14 106/64  07/01/14 (!) 147/80   Wt Readings from Last 3 Encounters:  01/11/16 146  lb 3.2 oz (66.3 kg)  12/30/14 152 lb 4 oz (69.1 kg)  07/01/14 152 lb (68.9 kg)    Physical Exam  Constitutional: No distress.  HENT:  Head: Normocephalic and atraumatic.  Mouth/Throat: Oropharynx is clear and moist. No oropharyngeal exudate.  Eyes: Conjunctivae are normal. Pupils are equal, round, and reactive to  light.  Neck: Neck supple.  Cardiovascular: Normal rate and regular rhythm.   Pulmonary/Chest: Effort normal and breath sounds normal.  Abdominal: Soft. Bowel sounds are normal. She exhibits no distension. There is no tenderness. There is no rebound and no guarding.  Genitourinary:  Genitourinary Comments: Pelvic exam deferred given age and history of hysterectomy. Patient declined breast exam today.  Musculoskeletal: She exhibits no edema.  Lymphadenopathy:    She has no cervical adenopathy.  Neurological: She is alert. Gait normal.  Skin: Skin is warm and dry. She is not diaphoretic.  Psychiatric: Mood and affect normal.     Assessment/Plan:   Routine general medical examination at a health care facility Overall patient is doing quite well. She is at a healthy weight. She'll continue diet and exercise. She is up-to-date on colon cancer screening. A mammogram will be arranged by the patient per her request. Pelvic exam deferred given age and history of hysterectomy. Tetanus vaccination and flu vaccination given today. She declines Zostavax. She declined hepatitis C testing. Refills on chronic medications were provided as well.   Orders Placed This Encounter  Procedures  . Flu Vaccine QUAD 36+ mos IM  . Tdap vaccine greater than or equal to 7yo IM  . Lipid Profile    Standing Status:   Future    Standing Expiration Date:   01/10/2017  . CBC    Standing Status:   Future    Standing Expiration Date:   01/10/2017  . Comp Met (CMET)    Standing Status:   Future    Standing Expiration Date:   01/10/2017  . TSH    Standing Status:   Future    Standing Expiration Date:   01/10/2017    Meds ordered this encounter  Medications  . losartan-hydrochlorothiazide (HYZAAR) 100-12.5 MG tablet    Sig: Take 1 tablet by mouth daily.    Dispense:  90 tablet    Refill:  1  . gabapentin (NEURONTIN) 300 MG capsule    Sig: Take 1 capsule (300 mg total) by mouth at bedtime.    Dispense:  90  capsule    Refill:  1  . FLUoxetine (PROZAC) 20 MG capsule    Sig: Take 1 capsule (20 mg total) by mouth daily.    Dispense:  90 capsule    Refill:  1  . fenofibrate 160 MG tablet    Sig: Take 1 tablet (160 mg total) by mouth daily.    Dispense:  90 tablet    Refill:  [place], MD [place]

## 2016-01-11 NOTE — Progress Notes (Signed)
Pre visit review using our clinic review tool, if applicable. No additional management support is needed unless otherwise documented below in the visit note. 

## 2016-01-11 NOTE — Assessment & Plan Note (Signed)
Overall patient is doing quite well. She is at a healthy weight. She'll continue diet and exercise. She is up-to-date on colon cancer screening. A mammogram will be arranged by the patient per her request. Pelvic exam deferred given age and history of hysterectomy. Tetanus vaccination and flu vaccination given today. She declines Zostavax. She declined hepatitis C testing. Refills on chronic medications were provided as well.

## 2016-01-11 NOTE — Patient Instructions (Signed)
Nice to meet you. We will check lab work at a future date when calling when the results come back. I have sent in your prescriptions. Make sure you get a flu shot later this fall. Please be sure you call to schedule your mammogram as well sometime in October.

## 2016-01-18 ENCOUNTER — Encounter: Payer: Self-pay | Admitting: Family Medicine

## 2016-01-18 ENCOUNTER — Other Ambulatory Visit (INDEPENDENT_AMBULATORY_CARE_PROVIDER_SITE_OTHER): Payer: Medicare Other

## 2016-01-18 DIAGNOSIS — Z13220 Encounter for screening for lipoid disorders: Secondary | ICD-10-CM

## 2016-01-18 DIAGNOSIS — Z0000 Encounter for general adult medical examination without abnormal findings: Secondary | ICD-10-CM | POA: Diagnosis not present

## 2016-01-18 LAB — COMPREHENSIVE METABOLIC PANEL
ALT: 16 U/L (ref 0–35)
AST: 31 U/L (ref 0–37)
Albumin: 4.1 g/dL (ref 3.5–5.2)
Alkaline Phosphatase: 38 U/L — ABNORMAL LOW (ref 39–117)
BUN: 18 mg/dL (ref 6–23)
CHLORIDE: 103 meq/L (ref 96–112)
CO2: 31 mEq/L (ref 19–32)
Calcium: 9.5 mg/dL (ref 8.4–10.5)
Creatinine, Ser: 1.03 mg/dL (ref 0.40–1.20)
GFR: 55.92 mL/min — AB (ref >60.00)
GLUCOSE: 94 mg/dL (ref 70–99)
POTASSIUM: 3.7 meq/L (ref 3.5–5.1)
Sodium: 140 mEq/L (ref 135–145)
Total Bilirubin: 0.5 mg/dL (ref 0.2–1.2)
Total Protein: 6.5 g/dL (ref 6.0–8.3)

## 2016-01-18 LAB — LIPID PANEL
Cholesterol: 170 mg/dL (ref 0–200)
HDL: 52.5 mg/dL (ref >39.00)
LDL Cholesterol: 101 mg/dL — ABNORMAL HIGH (ref 0–99)
NONHDL: 117.23
Total CHOL/HDL Ratio: 3
Triglycerides: 81 mg/dL (ref 0.0–149.0)
VLDL: 16.2 mg/dL (ref 0.0–40.0)

## 2016-01-18 LAB — CBC
HEMATOCRIT: 36.5 % (ref 36.0–46.0)
Hemoglobin: 12.5 g/dL (ref 12.0–15.0)
MCHC: 34.1 g/dL (ref 30.0–36.0)
MCV: 92.2 fl (ref 78.0–100.0)
Platelets: 193 10*3/uL (ref 150.0–400.0)
RBC: 3.96 Mil/uL (ref 3.87–5.11)
RDW: 13.7 % (ref 11.5–15.5)
WBC: 3.9 10*3/uL — ABNORMAL LOW (ref 4.0–10.5)

## 2016-01-18 LAB — TSH: TSH: 1.81 u[IU]/mL (ref 0.35–4.50)

## 2016-02-03 ENCOUNTER — Telehealth: Payer: Self-pay | Admitting: Family Medicine

## 2016-02-03 NOTE — Telephone Encounter (Signed)
Updated records, thanks

## 2016-02-03 NOTE — Telephone Encounter (Signed)
Pt had flu shot on 01/11/16. Thank you!

## 2016-02-20 ENCOUNTER — Other Ambulatory Visit: Payer: Self-pay | Admitting: Family Medicine

## 2016-02-20 DIAGNOSIS — Z1231 Encounter for screening mammogram for malignant neoplasm of breast: Secondary | ICD-10-CM

## 2016-03-02 ENCOUNTER — Ambulatory Visit
Admission: RE | Admit: 2016-03-02 | Discharge: 2016-03-02 | Disposition: A | Discharge status: Home / Self Care | Payer: Medicare Other | Source: Ambulatory Visit | Attending: Family Medicine | Admitting: Family Medicine

## 2016-03-02 DIAGNOSIS — Z1231 Encounter for screening mammogram for malignant neoplasm of breast: Secondary | ICD-10-CM

## 2016-06-05 ENCOUNTER — Other Ambulatory Visit: Payer: Self-pay | Admitting: Family Medicine

## 2016-06-05 DIAGNOSIS — G629 Polyneuropathy, unspecified: Secondary | ICD-10-CM

## 2016-06-05 NOTE — Telephone Encounter (Signed)
Last OV 01/11/16 last filled Gabapentin 01/11/16 90 1rf Fluoxetine 01/11/16 90 1rf  Hyzaar 01/11/16 90 1rf

## 2016-06-21 ENCOUNTER — Telehealth: Payer: Self-pay | Admitting: *Deleted

## 2016-06-21 NOTE — Telephone Encounter (Signed)
Patient states she had lower right rib pain and nausea with one episode of vomiting on Tuesday. She has not had any symptoms since. Patient states she will go to the ER if it occurs again and does not want an appointment at this time.

## 2016-06-21 NOTE — Telephone Encounter (Signed)
Pt requested a call to discuss her being high risk of a hear attack, On Tuesday night pt experienced rib pain and vomited once. After researching her symptoms online, she seems to think she could have had a heart attack.  Pt contact [phone]

## 2016-06-21 NOTE — Telephone Encounter (Signed)
Noted. CMA did offer the patient an appointment next week for evaluation. She reported no symptoms since Tuesday.

## 2016-11-22 ENCOUNTER — Other Ambulatory Visit: Payer: Self-pay | Admitting: Family Medicine

## 2016-11-22 DIAGNOSIS — G629 Polyneuropathy, unspecified: Secondary | ICD-10-CM

## 2017-01-22 ENCOUNTER — Encounter: Payer: Self-pay | Admitting: Family Medicine

## 2017-01-22 ENCOUNTER — Ambulatory Visit (INDEPENDENT_AMBULATORY_CARE_PROVIDER_SITE_OTHER): Payer: Medicare Other | Admitting: Family Medicine

## 2017-01-22 VITALS — BP 134/74 | HR 58 | Temp 97.9°F | Wt 146.2 lb

## 2017-01-22 DIAGNOSIS — E785 Hyperlipidemia, unspecified: Secondary | ICD-10-CM | POA: Diagnosis not present

## 2017-01-22 DIAGNOSIS — F325 Major depressive disorder, single episode, in full remission: Secondary | ICD-10-CM | POA: Diagnosis not present

## 2017-01-22 DIAGNOSIS — R5383 Other fatigue: Secondary | ICD-10-CM | POA: Diagnosis not present

## 2017-01-22 DIAGNOSIS — G629 Polyneuropathy, unspecified: Secondary | ICD-10-CM | POA: Diagnosis not present

## 2017-01-22 DIAGNOSIS — R1013 Epigastric pain: Secondary | ICD-10-CM | POA: Diagnosis not present

## 2017-01-22 DIAGNOSIS — K219 Gastro-esophageal reflux disease without esophagitis: Secondary | ICD-10-CM

## 2017-01-22 DIAGNOSIS — I10 Essential (primary) hypertension: Secondary | ICD-10-CM

## 2017-01-22 LAB — CBC
HCT: 40.5 % (ref 36.0–46.0)
HEMOGLOBIN: 13.8 g/dL (ref 12.0–15.0)
MCHC: 34 g/dL (ref 30.0–36.0)
MCV: 93.1 fl (ref 78.0–100.0)
PLATELETS: 195 10*3/uL (ref 150.0–400.0)
RBC: 4.36 Mil/uL (ref 3.87–5.11)
RDW: 12.9 % (ref 11.5–15.5)
WBC: 4.7 10*3/uL (ref 4.0–10.5)

## 2017-01-22 LAB — COMPREHENSIVE METABOLIC PANEL
ALT: 15 U/L (ref 0–35)
AST: 31 U/L (ref 0–37)
Albumin: 4.5 g/dL (ref 3.5–5.2)
Alkaline Phosphatase: 65 U/L (ref 39–117)
BUN: 15 mg/dL (ref 6–23)
CHLORIDE: 100 meq/L (ref 96–112)
CO2: 30 meq/L (ref 19–32)
Calcium: 10.1 mg/dL (ref 8.4–10.5)
Creatinine, Ser: 1.08 mg/dL (ref 0.40–1.20)
GFR: 52.79 mL/min — ABNORMAL LOW (ref >60.00)
Glucose, Bld: 94 mg/dL (ref 70–99)
POTASSIUM: 4.3 meq/L (ref 3.5–5.1)
Sodium: 138 mEq/L (ref 135–145)
Total Bilirubin: 0.6 mg/dL (ref 0.2–1.2)
Total Protein: 6.6 g/dL (ref 6.0–8.3)

## 2017-01-22 LAB — TSH: TSH: 2.05 u[IU]/mL (ref 0.35–4.50)

## 2017-01-22 LAB — LIPASE: Lipase: 67 U/L — ABNORMAL HIGH (ref 11.0–59.0)

## 2017-01-22 LAB — LIPID PANEL
Cholesterol: 243 mg/dL — ABNORMAL HIGH (ref 0–200)
HDL: 65 mg/dL (ref >39.00)
LDL Cholesterol: 159 mg/dL — ABNORMAL HIGH (ref 0–99)
NonHDL: 177.67
Total CHOL/HDL Ratio: 4
Triglycerides: 95 mg/dL (ref 0.0–149.0)
VLDL: 19 mg/dL (ref 0.0–40.0)

## 2017-01-22 LAB — VITAMIN B12: Vitamin B-12: 489 pg/mL (ref 211–911)

## 2017-01-22 MED ORDER — GABAPENTIN 100 MG PO CAPS: ORAL_CAPSULE | ORAL | 0 refills | Status: DC

## 2017-01-22 NOTE — Patient Instructions (Addendum)
Nice to see you. We'll check lab work today and contact with the results. We'll taper down on your gabapentin. If you have recurrence of your stomach pain please let us know.

## 2017-01-22 NOTE — Assessment & Plan Note (Signed)
Patient interested in coming off of gabapentin if possible. I provided her with a taper to do this as she has not tolerated coming off of it on her own previously. If she develops symptoms she'll let us know. When she comes off of this she'll let us know if she would like to try an alternative medication.

## 2017-01-22 NOTE — Assessment & Plan Note (Signed)
Notes some decreased energy. No depression. Could be related to her decreased oral intake. We'll check lab work as outlined below.

## 2017-01-22 NOTE — Progress Notes (Signed)
Katelyn Rumps, MD Phone: [phone]  Katelyn Knight is a 71 y.o. female who presents today for f/u.  HYPERTENSION  Disease Monitoring  Home BP Monitoring not checking Chest pain- no    Dyspnea- no Medications  Compliance-  Taking losartan, HCTZ.   Depression: Patient currently on Prozac. Does not want to stop this. No depression at this time. No SI or HI.  She reports she has had some epigastric some discomfort over the last little while. She discontinued her fenofibrate and this resolved. She had had some diarrhea with it though that has improved. She's had no nausea, vomiting, dysuria, vaginal bleeding, blood in her stool, or vaginal discharge. Does have a history of reflux for which she does not take any medication. She tries to eat small meals. She reports she tried multiple PPIs.  She has come off of her fenofibrate for her hyperlipidemia.  She notes she generally just feels weak and has decreased energy. She sleeps well while taking the gabapentin. She hasn't been eating as well with the stomach issue and thinks that may be contributing.   PMH: nonsmoker.   ROS see history of present illness  Objective  Physical Exam Vitals:   01/22/17 0802  BP: 134/74  Pulse: (!) 58  Temp: 97.9 F (36.6 C)  SpO2: 96%    BP Readings from Last 3 Encounters:  01/22/17 134/74  01/11/16 126/84  12/30/14 106/64   Wt Readings from Last 3 Encounters:  01/22/17 146 lb 3.2 oz (66.3 kg)  01/11/16 146 lb 3.2 oz (66.3 kg)  12/30/14 152 lb 4 oz (69.1 kg)    Physical Exam  Constitutional: No distress.  HENT:  Head: Normocephalic and atraumatic.  Cardiovascular: Normal rate, regular rhythm and normal heart sounds.   Pulmonary/Chest: Effort normal and breath sounds normal.  Abdominal: Soft. Bowel sounds are normal. She exhibits no distension. There is no tenderness.  Musculoskeletal: She exhibits no edema.  Neurological: She is alert. Gait normal.  Skin: Skin is warm and dry.  She is not diaphoretic.     Assessment/Plan: Please see individual problem list.  Hypertension At goal. Continue current medications. Check lab work as outlined below.  GERD (gastroesophageal reflux disease) Patient does note intermittent GERD symptoms. I offered a trial of Dexilant as she has not tried this previously though she declined. She'll monitor her diet.  Neuropathy Patient interested in coming off of gabapentin if possible. I provided her with a taper to do this as she has not tolerated coming off of it on her own previously. If she develops symptoms she'll let us know. When she comes off of this she'll let us know if she would like to try an alternative medication.  Hyperlipidemia Patient has come off fenofibrate. Check lipid panel.  Depression Well-controlled. Continue Prozac.  Epigastric pain This has improved with discontinuing her fenofibrate. Suspect likely related to that and we will check lab work as outlined below. Potentially could also be related to reflux or gastritis and I did offer her dexilant. Though she declined this as well. She'll monitor for recurrence.  Decreased energy Notes some decreased energy. No depression. Could be related to her decreased oral intake. We'll check lab work as outlined below.   Orders Placed This Encounter  Procedures  . Comp Met (CMET)  . CBC  . Lipid Profile  . Lipase  . TSH  . B12    Meds ordered this encounter  Medications  . gabapentin (NEURONTIN) 100 MG capsule  Sig: Take 200 mg by mouth daily at bedtime for 1 week, then take 100 mg by mouth daily at bedtime for 1 week, then take 100 mg every other day by mouth at bedtime for one week then discontinue    Dispense:  25 capsule    Refill:  0   Katelyn Rumps, MD [place]

## 2017-01-22 NOTE — Assessment & Plan Note (Signed)
Patient does note intermittent GERD symptoms. I offered a trial of Dexilant as she has not tried this previously though she declined. She'll monitor her diet.

## 2017-01-22 NOTE — Assessment & Plan Note (Signed)
Well-controlled. Continue Prozac.

## 2017-01-22 NOTE — Assessment & Plan Note (Signed)
At goal. Continue current medications. Check lab work as outlined below. 

## 2017-01-22 NOTE — Assessment & Plan Note (Signed)
Patient has come off fenofibrate. Check lipid panel.

## 2017-01-22 NOTE — Assessment & Plan Note (Signed)
This has improved with discontinuing her fenofibrate. Suspect likely related to that and we will check lab work as outlined below. Potentially could also be related to reflux or gastritis and I did offer her dexilant. Though she declined this as well. She'll monitor for recurrence.

## 2017-01-23 ENCOUNTER — Other Ambulatory Visit: Payer: Self-pay | Admitting: Family Medicine

## 2017-01-23 ENCOUNTER — Other Ambulatory Visit (INDEPENDENT_AMBULATORY_CARE_PROVIDER_SITE_OTHER): Payer: Medicare Other

## 2017-01-23 DIAGNOSIS — R748 Abnormal levels of other serum enzymes: Secondary | ICD-10-CM

## 2017-01-23 LAB — LIPASE: Lipase: 72 U/L — ABNORMAL HIGH (ref 11.0–59.0)

## 2017-01-30 ENCOUNTER — Telehealth: Payer: Self-pay | Admitting: *Deleted

## 2017-01-30 NOTE — Telephone Encounter (Signed)
Pt has requested a update on the gastroenterologist referral -Dr. Vira Agar  Pt contact  [phone]

## 2017-02-14 DIAGNOSIS — K219 Gastro-esophageal reflux disease without esophagitis: Secondary | ICD-10-CM | POA: Diagnosis not present

## 2017-02-14 DIAGNOSIS — R748 Abnormal levels of other serum enzymes: Secondary | ICD-10-CM | POA: Diagnosis not present

## 2017-02-15 ENCOUNTER — Other Ambulatory Visit: Payer: Self-pay | Admitting: Family Medicine

## 2017-02-15 DIAGNOSIS — G629 Polyneuropathy, unspecified: Secondary | ICD-10-CM

## 2017-04-02 ENCOUNTER — Other Ambulatory Visit: Payer: Self-pay | Admitting: Family Medicine

## 2017-04-02 DIAGNOSIS — Z139 Encounter for screening, unspecified: Secondary | ICD-10-CM

## 2017-04-30 ENCOUNTER — Ambulatory Visit: Payer: Self-pay

## 2017-06-05 ENCOUNTER — Other Ambulatory Visit: Payer: Self-pay | Admitting: Family Medicine

## 2017-07-09 ENCOUNTER — Telehealth: Payer: Self-pay | Admitting: Family Medicine

## 2017-07-09 NOTE — Telephone Encounter (Signed)
Copied from [place]. Topic: Quick Communication - See Telephone Encounter >> Jul 09, 2017  9:27 AM Arletha Grippe wrote: CRM for notification. See Telephone encounter for:   07/09/17. Pt would like to start acid blocker medication, she says this has already been discussed in an appt.  Pt can not be seen until April due to provider schedule.  Pt asking to have medication called in.  Cb is [phone]  [facility] [place]

## 2017-07-09 NOTE — Telephone Encounter (Signed)
Phone call to pt.  Inquired about her symptoms.  Reported she has acid reflux daily.  Does not really know if there are certain foods that affect this worse than others.  Reported the Acid reflux had "slightly improved" after stopping the Fenofibrate, last September, but continues to affect her quality of life. Stated she has tried several PPI's; "Prilosec, Nexium, Zantac, and Zegrid."  Reported of all the medications she has tried, the Zegrid was the most effective.  Is asking if Dr. Caryl Bis will order a trial of the newer medication for acid reflux that he mentioned at her office visit in September. (Dexilant)  She stated she only wants to try a 1 mo. supply, to see if it is helpful.  Advised will make Dr. Caryl Bis aware

## 2017-07-10 ENCOUNTER — Telehealth: Payer: Self-pay

## 2017-07-10 NOTE — Telephone Encounter (Signed)
Left VM for pt to call back to schedule appointment.

## 2017-07-10 NOTE — Telephone Encounter (Signed)
fyi

## 2017-07-10 NOTE — Telephone Encounter (Signed)
Copied from [place]. Topic: Quick Communication - See Telephone Encounter >> Jul 10, 2017  8:46 AM Juanda Chance, CMA wrote: CRM for notification. See Telephone encounter for:  Left message to return call to ask patient about symptoms. She will need to be evaluated since Dr.Sonnenberg is out of the office. [place] for pec to speak to patient and schedule appointment. 07/10/17. >> Jul 10, 2017 10:37 AM Cleaster Corin, NT wrote: Pt. Calling back from message left on vm. Let pt. Know about notes that were in crm to schedule an appt. Let pt. Know that I could get her an appt.today with J. Kordsmeier But pt. Declined appt. Said that she talked to nurse triage yesterday and doesn't want to speak with anyone else she will take care of things. Pt. Can be reached at [phone]

## 2017-07-10 NOTE — Telephone Encounter (Signed)
Left message to return call to ask patient about symptoms. She will need to be evaluated since Dr.Sonnenberg is out of the office. [place] for pec to speak to patient and schedule appointment.

## 2017-07-10 NOTE — Telephone Encounter (Signed)
FYI Called patient to inform we received a fax from [facility] stating the losartan-hctz was recalled. Patient states she received a new rx with a new lot.

## 2017-07-11 NOTE — Telephone Encounter (Signed)
I would suggest having her follow-up with me in the office for this to determine the best treatment. She certainly could try over the counter prilosec or nexium until seen in the office. Thanks.

## 2017-07-11 NOTE — Telephone Encounter (Signed)
Patient states she does not want to come in

## 2017-07-14 NOTE — Telephone Encounter (Signed)
Noted. She should try nexium or Prilosec over the counter. If she is not improving with that she should be seen in the office.

## 2017-07-15 MED ORDER — DEXLANSOPRAZOLE 30 MG PO CPDR: 30.0000 mg | DELAYED_RELEASE_CAPSULE | Freq: Every day | ORAL | 0 refills | Status: DC

## 2017-07-15 NOTE — Telephone Encounter (Signed)
I will send in a 30 day supply of dexilant for her to trial. She will need to be seen in the office for follow-up if this is going to become a chronic medication. She should contact us in one month to let us know how she is doing with this.  Please also advise if she develops abdominal pain or blood in her stool she needs to be evaluated. Thanks.

## 2017-07-15 NOTE — Telephone Encounter (Signed)
Patient states she has tried those but does not want to schedule an appointment.

## 2017-07-15 NOTE — Telephone Encounter (Signed)
Patient notified and would like [facility] [place]. Patient is scheduled.

## 2017-07-23 ENCOUNTER — Ambulatory Visit: Payer: Self-pay | Admitting: Family Medicine

## 2017-07-30 ENCOUNTER — Telehealth: Payer: Self-pay | Admitting: Family Medicine

## 2017-07-30 NOTE — Telephone Encounter (Signed)
Patient states she just saw the PA at her GI office and they stated she may have pancreatitis and it is usually medication related. She is wondering if the [facility] could have been causing this. Patient states she called her pharmacy and they have replaced her HYZAAR due to the recall.Patient received the new rx about a week or two ago. Patient states she has had the stomach problems for awhile and it has not improved. She would like to know if this could be related.The Dexilant has not helped much.

## 2017-07-30 NOTE — Telephone Encounter (Signed)
Please contact. Thank you

## 2017-07-30 NOTE — Telephone Encounter (Signed)
Copied from [place]. Topic: Quick Communication - See Telephone Encounter >> Jul 30, 2017  1:42 PM Burnis Medin, NT wrote: CRM for notification. See Telephone encounter for: Patient called and wanted to see if the nurse could give her a call regarding her losartan-hydrochlorothiazide (HYZAAR) 100-12.5 MG tablet.  07/30/17.

## 2017-07-30 NOTE — Telephone Encounter (Signed)
Patient saw the GI office last year for this.  Her pancreatic enzymes had returned to normal.  On review of potential side effects pancreatitis is listed for hydrochlorothiazide though there is not a percentage of this occurring listed.  Please find out what they switched her to at the pharmacy.  I would also suggest that she follow back up for this issue for reevaluation.  Thanks.

## 2017-07-31 NOTE — Telephone Encounter (Signed)
It is a little difficult to tell if she should switch the hydrochlorothiazide without evaluating her to determine if she does have an issue with her pancreas or if her symptoms are related to gastritis or a possible stomach ulcer.  If she has continued to have abdominal discomfort she should certainly be evaluated prior to seeing me in April.

## 2017-07-31 NOTE — Telephone Encounter (Signed)
Patient states she is still taking the hydrochlorothiazide they just switched to a different manufacturer. Patient is scheduled for follow up with you on 08/21/17. She is wondering if she should switch the hydrochlorothiazide.

## 2017-08-01 NOTE — Telephone Encounter (Signed)
Noted. Thanks.

## 2017-08-01 NOTE — Telephone Encounter (Signed)
Patient notified, patient is scheduled for Monday at 8 with Katelyn Knight, patient does not have a lot of availability and this is the only appointment she stated shew could make work. I did offer appointments with PCP but patient would not be able to make those times work.

## 2017-08-05 ENCOUNTER — Encounter: Payer: Self-pay | Admitting: Family

## 2017-08-05 ENCOUNTER — Ambulatory Visit (INDEPENDENT_AMBULATORY_CARE_PROVIDER_SITE_OTHER): Payer: Medicare Other | Admitting: Family

## 2017-08-05 VITALS — BP 122/60 | HR 58 | Temp 98.2°F | Resp 16 | Ht 65.0 in | Wt 152.0 lb

## 2017-08-05 DIAGNOSIS — R1013 Epigastric pain: Secondary | ICD-10-CM

## 2017-08-05 DIAGNOSIS — I10 Essential (primary) hypertension: Secondary | ICD-10-CM | POA: Diagnosis not present

## 2017-08-05 LAB — COMPREHENSIVE METABOLIC PANEL
ALT: 17 U/L (ref 0–35)
AST: 24 U/L (ref 0–37)
Albumin: 3.8 g/dL (ref 3.5–5.2)
Alkaline Phosphatase: 86 U/L (ref 39–117)
BUN: 17 mg/dL (ref 6–23)
CALCIUM: 9.2 mg/dL (ref 8.4–10.5)
CHLORIDE: 102 meq/L (ref 96–112)
CO2: 28 meq/L (ref 19–32)
CREATININE: 0.91 mg/dL (ref 0.40–1.20)
GFR: 64.23 mL/min (ref >60.00)
Glucose, Bld: 76 mg/dL (ref 70–99)
POTASSIUM: 4.3 meq/L (ref 3.5–5.1)
Sodium: 136 mEq/L (ref 135–145)
Total Bilirubin: 0.4 mg/dL (ref 0.2–1.2)
Total Protein: 6.4 g/dL (ref 6.0–8.3)

## 2017-08-05 LAB — LIPASE: Lipase: 34 U/L (ref 11.0–59.0)

## 2017-08-05 NOTE — Assessment & Plan Note (Addendum)
Chronic for years, largely unchanged although notes some improvement on Dexilant ( has been on for one month). No longer on fenofibrate. Advised I think unlikely to be related to recall: losartan.  Differentials include peptic ulcer disease , acid reflux, or esophageal stricture.  Advised to make a follow up with Sharyn Lull at GI for further evaluation. ? ERCP, barium swallow? Close follow up and advised patient to let us know of any new or worsening symptoms. She says she will make an appt with GI in a month.

## 2017-08-05 NOTE — Assessment & Plan Note (Signed)
At goal without any medication today. Advised to dc medication for now and monitor blood pressure. She will let us know if becomes elevated.

## 2017-08-05 NOTE — Patient Instructions (Addendum)
Considering peptic ulcer disease , acid reflux, or esophageal stricture.   Please make follow with Sharyn Lull at GI. Please reschedule as you would benefit from further evaluation- MRCP, swallow evalution   Hold blood pressure for now and monitor blood pressure ( goal less than 130/80) Follow this with Dr Caryl Bis.   Close follow up with [place]- let us know of any new or worsening symptoms.

## 2017-08-05 NOTE — Progress Notes (Signed)
Subjective:    Patient ID: Katelyn Knight, female    DOB: 1945-07-02, 72 y.o.   MRN: [id number]  CC: Katelyn Knight is a 72 y.o. female who presents today for follow up.   HPI: Epigastric pain, for years, unchanged. More epigastric . Pain is there today. Pain doesn't feel crushing. Pain is not worse with exercise, such as on elliptical.  Feels like 'want to scratch it. ' Does not post herpetic pain over right side of abdomen.  Peanut butter makes pain improve. No n, v, weight loss. Occasionally feels like something is 'stuck in chest' , and then would stretch out lay down. , trouble swallowing.  Notes having a 'stress test for this pain in past. '   Notes more diarrhea since on dexilant. No alcohol, nsaids.   Wandering if recall of losartan- HCTZ, has stopped taking it. Not sure why she is on BP medication.   Denies exertional chest pain or pressure, numbness or tingling radiating to left arm or jaw, palpitations, dizziness, frequent headaches, changes in vision, or shortness of breath.   Has seen GI and suspected the fenofibrate caused pancreatitis.   Unable to do colonoscopy due to tortuous stool. Has done cologuard in the past, negative 2016. Declines me to order this year.   Seen by pcp 01/2017 for epigastric pain; dexilant one month ago, sometimes helpful.  GISharyn Lull 02/2017- discused MRCP.   HISTORY:  Past Medical History:  Diagnosis Date  . [facility]    Dr Jana Hakim DX 2007  . HTN (hypertension)   . Hyperlipidemia   . Menopausal symptom   . Osteoporosis   . Shingles 2014  . URI (upper respiratory infection)    Past Surgical History:  Procedure Laterality Date  . ABDOMINAL HYSTERECTOMY    . ABDOMINAL HYSTERECTOMY    . APPENDECTOMY     At 72 years old  . BREAST LUMPECTOMY  01/2006   Dx Breast Cancer  . BUNIONECTOMY     No family history on file.  Allergies: Patient has no known allergies. Current Outpatient Medications on File Prior to Visit    Medication Sig Dispense Refill  . Dexlansoprazole 30 MG capsule Take 1 capsule (30 mg total) by mouth daily. 30 capsule 0  . FLUoxetine (PROZAC) 20 MG capsule TAKE 1 CAPSULE BY MOUTH  DAILY 90 capsule 1  . losartan-hydrochlorothiazide (HYZAAR) 100-12.5 MG tablet TAKE 1 TABLET BY MOUTH  DAILY 90 tablet 1  . gabapentin (NEURONTIN) 100 MG capsule TAKE 2 CAPSULES AT BEDTIME FOR 1 WEEK, THEN 1 AT BEDTIME FOR 1 WEEK, THEN 1 EVERY OTHER DAY FOR 1 WK (Patient not taking: Reported on 08/05/2017) 25 capsule 0   No current facility-administered medications on file prior to visit.     Social History   Tobacco Use  . Smoking status: Never Smoker  . Smokeless tobacco: Never Used  Substance Use Topics  . Alcohol use: Yes    Comment: Occasional  . Drug use: No    Review of Systems  Constitutional: Negative for chills and fever.  Respiratory: Negative for cough.   Cardiovascular: Negative for chest pain and palpitations.  Gastrointestinal: Positive for diarrhea (occasional from dexilant). Negative for abdominal distention, abdominal pain, anal bleeding, blood in stool, constipation, nausea, rectal pain and vomiting.      Objective:    BP 122/60 (BP Location: Left Arm, Patient Position: Sitting, Cuff Size: Normal)   Pulse (!) 58   Temp 98.2 F (36.8 C) (Oral)  Resp 16   Ht 5\' 5"  (1.651 m)   Wt 152 lb (68.9 kg)   SpO2 98%   BMI 25.29 kg/m  BP Readings from Last 3 Encounters:  08/05/17 122/60  01/22/17 134/74  01/11/16 126/84   Wt Readings from Last 3 Encounters:  08/05/17 152 lb (68.9 kg)  01/22/17 146 lb 3.2 oz (66.3 kg)  01/11/16 146 lb 3.2 oz (66.3 kg)    Physical Exam  Constitutional: She appears well-developed and well-nourished.  Eyes: Conjunctivae are normal.  Cardiovascular: Normal rate, regular rhythm, normal heart sounds and normal pulses.  Pulmonary/Chest: Effort normal and breath sounds normal. She has no wheezes. She has no rhonchi. She has no rales. She exhibits no  tenderness and no bony tenderness.  Abdominal: Soft. Normal appearance and bowel sounds are normal. She exhibits no distension, no fluid wave, no ascites and no mass. There is no tenderness. There is no rigidity, no rebound, no guarding and no CVA tenderness.  Neurological: She is alert.  Skin: Skin is warm and dry.  Psychiatric: She has a normal mood and affect. Her speech is normal and behavior is normal. Thought content normal.  Vitals reviewed.      Assessment & Plan:   Problem List Items Addressed This Visit      Cardiovascular and Mediastinum   Hypertension    At goal without any medication today. Advised to dc medication for now and monitor blood pressure. She will let us know if becomes elevated.         Other   Epigastric pain - Primary    Chronic for years, largely unchanged although notes some improvement on Dexilant ( has been on for one month). No longer on fenofibrate. Advised I think unlikely to be related to recall: losartan.  Differentials include peptic ulcer disease , acid reflux, or esophageal stricture.  Advised to make a follow up with Sharyn Lull at GI for further evaluation. ? ERCP, barium swallow? Close follow up and advised patient to let us know of any new or worsening symptoms. She says she will make an appt with GI in a month.         Relevant Orders   Comprehensive metabolic panel   Lipase   H. pylori breath test       I am having Katelyn Knight maintain her gabapentin, FLUoxetine, losartan-hydrochlorothiazide, and Dexlansoprazole.   No orders of the defined types were placed in this encounter.   Return precautions given.   Risks, benefits, and alternatives of the medications and treatment plan prescribed today were discussed, and patient expressed understanding.   Education regarding symptom management and diagnosis given to patient on AVS.  Continue to follow with Leone Haven, MD for routine health maintenance.   Melvyn Novas [place]  and I agreed with plan.   Mable Paris, FNP

## 2017-08-06 LAB — H. PYLORI BREATH TEST: H. pylori Breath Test: NOT DETECTED

## 2017-08-07 ENCOUNTER — Other Ambulatory Visit: Payer: Self-pay | Admitting: Family Medicine

## 2017-08-14 ENCOUNTER — Telehealth: Payer: Self-pay | Admitting: Family

## 2017-08-14 NOTE — Telephone Encounter (Signed)
Call pt  I do not see where she has made a follow up appt with GI Tammi Klippel since I saw her 3/25.    Michelle sent me a nice note and would be happy to see her for the epigastric pain.   Does patient plan to call back and make a f/u with GI?   I would strongly advise this as further studies may be helpful in differentiating her pain.   Let me know how I can help.

## 2017-08-20 NOTE — Telephone Encounter (Signed)
Left voice mail to call back ok for PEC to speak to patient. My chart message regarding below.

## 2017-08-21 ENCOUNTER — Other Ambulatory Visit: Payer: Self-pay

## 2017-08-21 ENCOUNTER — Encounter: Payer: Self-pay | Admitting: Family Medicine

## 2017-08-21 ENCOUNTER — Ambulatory Visit (INDEPENDENT_AMBULATORY_CARE_PROVIDER_SITE_OTHER): Payer: Medicare Other | Admitting: Family Medicine

## 2017-08-21 VITALS — BP 120/68 | HR 70 | Temp 97.6°F | Ht 65.0 in | Wt 146.2 lb

## 2017-08-21 DIAGNOSIS — M25569 Pain in unspecified knee: Secondary | ICD-10-CM | POA: Insufficient documentation

## 2017-08-21 DIAGNOSIS — M25561 Pain in right knee: Secondary | ICD-10-CM

## 2017-08-21 DIAGNOSIS — K219 Gastro-esophageal reflux disease without esophagitis: Secondary | ICD-10-CM

## 2017-08-21 DIAGNOSIS — I10 Essential (primary) hypertension: Secondary | ICD-10-CM

## 2017-08-21 DIAGNOSIS — M25562 Pain in left knee: Secondary | ICD-10-CM

## 2017-08-21 DIAGNOSIS — E785 Hyperlipidemia, unspecified: Secondary | ICD-10-CM

## 2017-08-21 LAB — COMPREHENSIVE METABOLIC PANEL
ALBUMIN: 4.3 g/dL (ref 3.5–5.2)
ALT: 21 U/L (ref 0–35)
AST: 31 U/L (ref 0–37)
Alkaline Phosphatase: 107 U/L (ref 39–117)
BILIRUBIN TOTAL: 0.6 mg/dL (ref 0.2–1.2)
BUN: 10 mg/dL (ref 6–23)
CHLORIDE: 101 meq/L (ref 96–112)
CO2: 30 meq/L (ref 19–32)
CREATININE: 0.99 mg/dL (ref 0.40–1.20)
Calcium: 9.8 mg/dL (ref 8.4–10.5)
GFR: 58.27 mL/min — ABNORMAL LOW (ref >60.00)
Glucose, Bld: 92 mg/dL (ref 70–99)
Potassium: 4 mEq/L (ref 3.5–5.1)
SODIUM: 138 meq/L (ref 135–145)
Total Protein: 7 g/dL (ref 6.0–8.3)

## 2017-08-21 LAB — LIPID PANEL
CHOL/HDL RATIO: 5
Cholesterol: 272 mg/dL — ABNORMAL HIGH (ref 0–200)
HDL: 60.4 mg/dL (ref >39.00)
LDL CALC: 185 mg/dL — AB (ref 0–99)
NonHDL: 212
Triglycerides: 134 mg/dL (ref 0.0–149.0)
VLDL: 26.8 mg/dL (ref 0.0–40.0)

## 2017-08-21 MED ORDER — LOSARTAN POTASSIUM-HCTZ 100-12.5 MG PO TABS: 1.0000 | ORAL_TABLET | Freq: Every day | ORAL | 3 refills | Status: DC

## 2017-08-21 MED ORDER — FLUOXETINE HCL 40 MG PO CAPS: 40.0000 mg | ORAL_CAPSULE | Freq: Every day | ORAL | 1 refills | Status: DC

## 2017-08-21 NOTE — Assessment & Plan Note (Signed)
At goal.  She is back on medication.  We will check a CMP today.

## 2017-08-21 NOTE — Assessment & Plan Note (Signed)
Continues to have some issues with this.  She wants to trial Dexilant every other day.  She states she will contact GI to reschedule her follow-up that she missed.

## 2017-08-21 NOTE — Assessment & Plan Note (Signed)
Possibly related to medication though more likely osteoarthritis versus meniscal issue as she does note it will occasionally give out on her.  This has improved.  She will monitor and if it worsens she will let us know.

## 2017-08-21 NOTE — Progress Notes (Signed)
Tommi Rumps, MD Phone: [phone]  Katelyn Knight is a 72 y.o. female who presents today for f/u.  GERD:   Reflux symptoms: improved though still has some symptoms of burning  Abd pain: no   Blood in stool: no  Dysphagia: no   EGD: years ago  Medication: dexilant intermittently  HYPERTENSION  Disease Monitoring  Home BP Monitoring not checking Chest pain- no    Dyspnea- no Medications  Compliance-  Taking losartan-HCTZ.   Depression: Does note some intermittent depressive symptoms.  Some days she would just like to scream.  No anxiety.  No SI or HI.  She is on Prozac and wonders about additional treatment for this.  She does note her bilateral knees had been swelling a little bit after starting on the [place].  Notes there was some discomfort related to this.  No injury.  No increased activity level.  Has improved somewhat since coming off of the Norco recently.  Was on fenofibrate previously for her cholesterol though discontinued this after a pancreatitis episode last year.  She is due for recheck of cholesterol.     Social History   Tobacco Use  Smoking Status Never Smoker  Smokeless Tobacco Never Used     ROS see history of present illness  Objective  Physical Exam Vitals:   08/21/17 0806  BP: 120/68  Pulse: 70  Temp: 97.6 F (36.4 C)  SpO2: 98%    BP Readings from Last 3 Encounters:  08/21/17 120/68  08/05/17 122/60  01/22/17 134/74   Wt Readings from Last 3 Encounters:  08/21/17 146 lb 3.2 oz (66.3 kg)  08/05/17 152 lb (68.9 kg)  01/22/17 146 lb 3.2 oz (66.3 kg)    Physical Exam  Constitutional: No distress.  Cardiovascular: Normal rate, regular rhythm and normal heart sounds.  Pulmonary/Chest: Effort normal and breath sounds normal.  Abdominal: Soft. Bowel sounds are normal. She exhibits no distension. There is no tenderness.  Musculoskeletal: She exhibits no edema.  Bilateral knees with no apparent effusion, tenderness, warmth,  or erythema, no ligamentous laxity, negative McMurray's  Neurological: She is alert.  Skin: Skin is warm and dry. She is not diaphoretic.     Assessment/Plan: Please see individual problem list.  Hypertension At goal.  She is back on medication.  We will check a CMP today.  GERD (gastroesophageal reflux disease) Continues to have some issues with this.  She wants to trial Dexilant every other day.  She states she will contact GI to reschedule her follow-up that she missed.  Hyperlipidemia Needs recheck of cholesterol.  Depression Some increased symptoms.  No SI.  We will increase her Prozac.  Knee pain Possibly related to medication though more likely osteoarthritis versus meniscal issue as she does note it will occasionally give out on her.  This has improved.  She will monitor and if it worsens she will let us know.   Orders Placed This Encounter  Procedures  . Comp Met (CMET)  . Lipid panel    Meds ordered this encounter  Medications  . FLUoxetine (PROZAC) 40 MG capsule    Sig: Take 1 capsule (40 mg total) by mouth daily.    Dispense:  90 capsule    Refill:  1  . losartan-hydrochlorothiazide (HYZAAR) 100-12.5 MG tablet    Sig: Take 1 tablet by mouth daily.    Dispense:  90 tablet    Refill:  [place], MD [place]

## 2017-08-21 NOTE — Patient Instructions (Signed)
Nice to see you. Please monitor your knees and if they worsen again please let us know. You can try taking the Dexilant every other day and see if that is still beneficial.  Please call GI to set up follow-up. We will increase your Prozac to 40 mg daily.  You may take two 20 mg tablets once daily until you run out of your current supply. We will contact you with your lab results.

## 2017-08-21 NOTE — Assessment & Plan Note (Signed)
Some increased symptoms.  No SI.  We will increase her Prozac.

## 2017-08-21 NOTE — Assessment & Plan Note (Signed)
Needs recheck of cholesterol.

## 2017-08-22 NOTE — Progress Notes (Signed)
Patient states she will schedule it herself

## 2017-08-23 ENCOUNTER — Encounter: Payer: Self-pay | Admitting: *Deleted

## 2017-08-28 NOTE — Telephone Encounter (Signed)
Unread mychart message mailed to patient 

## 2017-08-31 ENCOUNTER — Other Ambulatory Visit: Payer: Self-pay | Admitting: Family Medicine

## 2017-09-10 NOTE — Telephone Encounter (Signed)
Patient was seen by Dr Caryl Bis on 08/21/17 and will re-schedule follow up with GI

## 2017-09-11 NOTE — Telephone Encounter (Signed)
Noted  

## 2017-09-26 ENCOUNTER — Other Ambulatory Visit: Payer: Self-pay | Admitting: Family Medicine

## 2017-10-15 DIAGNOSIS — K219 Gastro-esophageal reflux disease without esophagitis: Secondary | ICD-10-CM | POA: Diagnosis not present

## 2017-10-15 DIAGNOSIS — R1013 Epigastric pain: Secondary | ICD-10-CM | POA: Diagnosis not present

## 2017-10-15 DIAGNOSIS — R748 Abnormal levels of other serum enzymes: Secondary | ICD-10-CM | POA: Diagnosis not present

## 2017-10-27 ENCOUNTER — Other Ambulatory Visit: Payer: Self-pay | Admitting: Family Medicine

## 2017-11-28 ENCOUNTER — Ambulatory Visit (INDEPENDENT_AMBULATORY_CARE_PROVIDER_SITE_OTHER): Payer: Medicare Other | Admitting: Family Medicine

## 2017-11-28 ENCOUNTER — Encounter: Payer: Self-pay | Admitting: Family Medicine

## 2017-11-28 VITALS — BP 120/80 | HR 60 | Temp 98.5°F | Wt 146.0 lb

## 2017-11-28 DIAGNOSIS — Z8679 Personal history of other diseases of the circulatory system: Secondary | ICD-10-CM | POA: Diagnosis not present

## 2017-11-28 DIAGNOSIS — F325 Major depressive disorder, single episode, in full remission: Secondary | ICD-10-CM

## 2017-11-28 DIAGNOSIS — K219 Gastro-esophageal reflux disease without esophagitis: Secondary | ICD-10-CM | POA: Diagnosis not present

## 2017-11-28 DIAGNOSIS — I10 Essential (primary) hypertension: Secondary | ICD-10-CM | POA: Diagnosis not present

## 2017-11-28 DIAGNOSIS — E785 Hyperlipidemia, unspecified: Secondary | ICD-10-CM | POA: Diagnosis not present

## 2017-11-28 MED ORDER — FLUOXETINE HCL 20 MG PO CAPS
20.0000 mg | ORAL_CAPSULE | Freq: Every day | ORAL | 2 refills | Status: DC
Start: 2017-11-28 — End: 2018-09-23

## 2017-11-28 MED ORDER — ROSUVASTATIN CALCIUM 10 MG PO TABS: ORAL_TABLET | ORAL | 3 refills | Status: DC

## 2017-11-28 NOTE — Progress Notes (Signed)
  Tommi Rumps, MD Phone: [phone]  ANIJAH SPOHR is a 72 y.o. female who presents today for f/u.  CC: HTN, hyperlipidemia, depression, GERD  HYPERTENSION  Disease Monitoring  Home BP Monitoring never high per her report Chest pressure- no    Dyspnea- no Medications  Compliance-  Taking losartan/hctz.  Edema- no  GERD:   Reflux symptoms: does have occasional symptoms, occasional tingle in her chest, no chest pressure or pain, improves with exertion and laying down, no other symptoms with this   Abd pain: no   Blood in stool: no  Dysphagia: no   EGD: no  Medication: dexilant In the past has been on zegrid which was beneficial.  She is also sleeping on a wedge.  Found to have hyperlipidemia.  She is willing to start on Crestor though only 3 days a week at 10 mg.  Depression: She notes no depressive symptoms.  She decreased her Prozac dose back down to 20.  No SI.  Patient reports about 25 years ago she was advised she had mitral valve prolapse.    Social History   Tobacco Use  Smoking Status Never Smoker  Smokeless Tobacco Never Used     ROS see history of present illness  Objective  Physical Exam Vitals:   11/28/17 0926  BP: 120/80  Pulse: 60  Temp: 98.5 F (36.9 C)  SpO2: 97%    BP Readings from Last 3 Encounters:  11/28/17 120/80  08/21/17 120/68  08/05/17 122/60   Wt Readings from Last 3 Encounters:  11/28/17 146 lb (66.2 kg)  08/21/17 146 lb 3.2 oz (66.3 kg)  08/05/17 152 lb (68.9 kg)    Physical Exam  Constitutional: No distress.  Cardiovascular: Normal rate, regular rhythm and normal heart sounds.  Pulmonary/Chest: Effort normal and breath sounds normal.  Abdominal: Soft. Bowel sounds are normal. She exhibits no distension. There is no tenderness.  Musculoskeletal: She exhibits no edema.  Neurological: She is alert.  Skin: Skin is warm and dry. She is not diaphoretic.     Assessment/Plan: Please see individual problem  list.  Hypertension Well-controlled.  Continue current regimen.  GERD (gastroesophageal reflux disease) Symptoms have improved.  She will continue with Dexilant.  She did just see GI.  Hyperlipidemia Start on Crestor 10 mg 3 times weekly.  Recheck labs in 1 month.  History of mitral valve prolapse Patient reports a history of this from 20+ years ago.  Offered echo to follow-up on this though she declines.  Depression Improved.  No SI.  Continue Prozac 20 mg.   Orders Placed This Encounter  Procedures  . Comp Met (CMET)    Standing Status:   Future    Standing Expiration Date:   11/29/2018  . LDL cholesterol, direct    Standing Status:   Future    Standing Expiration Date:   11/29/2018    Meds ordered this encounter  Medications  . rosuvastatin (CRESTOR) 10 MG tablet    Sig: Take 10 mg (1 tablet) by mouth on Monday, Wednesday, and Friday    Dispense:  12 tablet    Refill:  3  . FLUoxetine (PROZAC) 20 MG capsule    Sig: Take 1 capsule (20 mg total) by mouth daily.    Dispense:  90 capsule    Refill:  2     Tommi Rumps, MD [place]

## 2017-11-28 NOTE — Assessment & Plan Note (Signed)
Well-controlled.  Continue current regimen. 

## 2017-11-28 NOTE — Assessment & Plan Note (Signed)
Patient reports a history of this from 20+ years ago.  Offered echo to follow-up on this though she declines.

## 2017-11-28 NOTE — Assessment & Plan Note (Signed)
Start on Crestor 10 mg 3 times weekly.  Recheck labs in 1 month.

## 2017-11-28 NOTE — Patient Instructions (Signed)
Nice to see you. We will start you on Crestor.  If you have muscle aches please let us know. Please continue your Dexilant.  Please continue your blood pressure medication and Prozac.

## 2017-11-28 NOTE — Assessment & Plan Note (Signed)
Symptoms have improved.  She will continue with Dexilant.  She did just see GI.

## 2017-11-28 NOTE — Assessment & Plan Note (Signed)
Improved.  No SI.  Continue Prozac 20 mg.

## 2017-12-03 ENCOUNTER — Ambulatory Visit
Admission: RE | Admit: 2017-12-03 | Discharge: 2017-12-03 | Disposition: A | Discharge status: Home / Self Care | Payer: Medicare Other | Source: Ambulatory Visit | Attending: Family Medicine | Admitting: Family Medicine

## 2017-12-03 DIAGNOSIS — Z139 Encounter for screening, unspecified: Secondary | ICD-10-CM

## 2017-12-03 DIAGNOSIS — Z1231 Encounter for screening mammogram for malignant neoplasm of breast: Secondary | ICD-10-CM | POA: Insufficient documentation

## 2017-12-03 HISTORY — DX: Personal history of irradiation: Z92.3

## 2017-12-24 DIAGNOSIS — H524 Presbyopia: Secondary | ICD-10-CM | POA: Diagnosis not present

## 2017-12-24 DIAGNOSIS — H43811 Vitreous degeneration, right eye: Secondary | ICD-10-CM | POA: Diagnosis not present

## 2017-12-24 DIAGNOSIS — H2513 Age-related nuclear cataract, bilateral: Secondary | ICD-10-CM | POA: Diagnosis not present

## 2017-12-24 DIAGNOSIS — H25013 Cortical age-related cataract, bilateral: Secondary | ICD-10-CM | POA: Diagnosis not present

## 2018-01-01 ENCOUNTER — Other Ambulatory Visit (INDEPENDENT_AMBULATORY_CARE_PROVIDER_SITE_OTHER): Payer: Medicare Other

## 2018-01-01 DIAGNOSIS — E785 Hyperlipidemia, unspecified: Secondary | ICD-10-CM | POA: Diagnosis not present

## 2018-01-01 LAB — COMPREHENSIVE METABOLIC PANEL
ALT: 17 U/L (ref 0–35)
AST: 25 U/L (ref 0–37)
Albumin: 4.4 g/dL (ref 3.5–5.2)
Alkaline Phosphatase: 98 U/L (ref 39–117)
BUN: 14 mg/dL (ref 6–23)
CHLORIDE: 99 meq/L (ref 96–112)
CO2: 30 mEq/L (ref 19–32)
Calcium: 10.3 mg/dL (ref 8.4–10.5)
Creatinine, Ser: 1.01 mg/dL (ref 0.40–1.20)
GFR: 56.89 mL/min — ABNORMAL LOW (ref >60.00)
GLUCOSE: 93 mg/dL (ref 70–99)
POTASSIUM: 4 meq/L (ref 3.5–5.1)
SODIUM: 136 meq/L (ref 135–145)
Total Bilirubin: 0.6 mg/dL (ref 0.2–1.2)
Total Protein: 7 g/dL (ref 6.0–8.3)

## 2018-01-01 LAB — LDL CHOLESTEROL, DIRECT: LDL DIRECT: 98 mg/dL

## 2018-01-20 ENCOUNTER — Other Ambulatory Visit: Payer: Self-pay

## 2018-01-20 MED ORDER — ROSUVASTATIN CALCIUM 10 MG PO TABS: ORAL_TABLET | ORAL | 3 refills | Status: DC

## 2018-02-06 ENCOUNTER — Telehealth: Payer: Self-pay | Admitting: Family Medicine

## 2018-02-06 DIAGNOSIS — E785 Hyperlipidemia, unspecified: Secondary | ICD-10-CM

## 2018-02-06 NOTE — Telephone Encounter (Signed)
Called and spoke with pt. Pt is NOT OK with taking them separately she only wants to take 1 pill she is happy to try something else that would be similar.   Sent to PCP  * I did NOT notice patient had perviously stated in the first message that she did NOT want to take the medications separately*

## 2018-02-06 NOTE — Telephone Encounter (Signed)
Copied from [place] [id number]. Topic: Quick Communication - Rx Refill/Question >> Feb 06, 2018  9:02 AM Margot Ables wrote: Medication: losartan-hydrochlorothiazide (HYZAAR) 100-12.5 MG tablet - pt has 4 left - pt states medication is on backorder and no known date to have it back - pt is requesting 30 day supply of a different similar combination medication (does not want to go to separate for each med) to trial - ok to notify pt via mychart  Has the patient contacted their pharmacy? yes Preferred Pharmacy (with phone number or street name): [id number] - [place], [place] [phone] (Phone) [phone] (Fax)

## 2018-02-06 NOTE — Telephone Encounter (Signed)
Sent to PCP to advise   We could just sent the Rx in separately until Rx is back in stock send Losartan 100 MG and HCTZ 12.5 MG   Please advise if OK to do so.  Thanks

## 2018-02-06 NOTE — Telephone Encounter (Signed)
rx change request

## 2018-02-06 NOTE — Telephone Encounter (Signed)
I would suggest that we just send in the separate medications.  Please confirm that the patient is okay with this as it appears that she does not want to go on a separate medication for each based on the initial note.  If she is okay with this I will send in separate medications.  Thanks.

## 2018-02-07 MED ORDER — OLMESARTAN MEDOXOMIL-HCTZ 20-12.5 MG PO TABS: 1.0000 | ORAL_TABLET | Freq: Every day | ORAL | 1 refills | Status: DC

## 2018-02-07 NOTE — Telephone Encounter (Signed)
Pt called in again. She states that she only has 2 pills left. She supposes if the only way she can get a prescription is to take 2 separate medications she will but she voice again that she will not be happy. (as noted in the original msg "does not want to go to separate for each med" -  Pt would need 2-3 months of medication and will discuss with Dr. Caryl Bis at next [place] - Please call to advise  [id number] [address], [place] - [place]          [phone] (Phone) [phone] (Fax)

## 2018-02-07 NOTE — Telephone Encounter (Signed)
I spoke with the patient.  I sent a new blood pressure medication in.  She needs lab work completed in 7 days.  She also needs a blood pressure check at that time.  I have placed orders.  Please get her scheduled for labs.  Thanks.

## 2018-02-10 NOTE — Telephone Encounter (Signed)
Called and spoke with pt. Pt advised and scheduled for fasting lab work. Pt stated that she will keep track of her BP at home and will bring her documented BP readings to her next appt with Dr. Caryl Bis.

## 2018-02-20 ENCOUNTER — Other Ambulatory Visit (INDEPENDENT_AMBULATORY_CARE_PROVIDER_SITE_OTHER): Payer: Medicare Other

## 2018-02-20 DIAGNOSIS — E785 Hyperlipidemia, unspecified: Secondary | ICD-10-CM | POA: Diagnosis not present

## 2018-02-20 LAB — COMPREHENSIVE METABOLIC PANEL
ALBUMIN: 4.4 g/dL (ref 3.5–5.2)
ALT: 19 U/L (ref 0–35)
AST: 28 U/L (ref 0–37)
Alkaline Phosphatase: 93 U/L (ref 39–117)
BUN: 16 mg/dL (ref 6–23)
CALCIUM: 9.9 mg/dL (ref 8.4–10.5)
CHLORIDE: 100 meq/L (ref 96–112)
CO2: 27 mEq/L (ref 19–32)
CREATININE: 0.98 mg/dL (ref 0.40–1.20)
GFR: 58.88 mL/min — AB (ref >60.00)
Glucose, Bld: 97 mg/dL (ref 70–99)
POTASSIUM: 4.6 meq/L (ref 3.5–5.1)
Sodium: 137 mEq/L (ref 135–145)
Total Bilirubin: 0.6 mg/dL (ref 0.2–1.2)
Total Protein: 7 g/dL (ref 6.0–8.3)

## 2018-02-20 LAB — LDL CHOLESTEROL, DIRECT: Direct LDL: 88 mg/dL

## 2018-02-24 ENCOUNTER — Telehealth: Payer: Self-pay | Admitting: Family Medicine

## 2018-02-24 NOTE — Telephone Encounter (Signed)
Copied from [place] [id number]. Topic: Quick Communication - See Telephone Encounter >> Feb 24, 2018 12:16 PM Cecelia Byars, NT wrote: Patient called and would like a return call ,she states someone from the practice left a message saying there were lab results from 02/20/18 that they needed to go over with her and she does not remember the  name of the person  ,please call her at [phone]

## 2018-02-25 NOTE — Telephone Encounter (Signed)
Called and spoke with pt. Pt advised and voiced understanding. Pt declined completing a urine protein test and renal ultrasound. Pt will discuss this with PCP at her next OV.   Sent to PCP as an [place]

## 2018-03-26 ENCOUNTER — Ambulatory Visit: Payer: Self-pay | Admitting: Family Medicine

## 2018-04-03 ENCOUNTER — Other Ambulatory Visit: Payer: Self-pay | Admitting: Family Medicine

## 2018-04-09 ENCOUNTER — Ambulatory Visit (INDEPENDENT_AMBULATORY_CARE_PROVIDER_SITE_OTHER): Payer: Medicare Other | Admitting: Family Medicine

## 2018-04-09 ENCOUNTER — Encounter: Payer: Self-pay | Admitting: Family Medicine

## 2018-04-09 DIAGNOSIS — I10 Essential (primary) hypertension: Secondary | ICD-10-CM | POA: Diagnosis not present

## 2018-04-09 DIAGNOSIS — N183 Chronic kidney disease, stage 3 unspecified: Secondary | ICD-10-CM | POA: Insufficient documentation

## 2018-04-09 DIAGNOSIS — E785 Hyperlipidemia, unspecified: Secondary | ICD-10-CM | POA: Diagnosis not present

## 2018-04-09 DIAGNOSIS — K219 Gastro-esophageal reflux disease without esophagitis: Secondary | ICD-10-CM | POA: Diagnosis not present

## 2018-04-09 NOTE — Patient Instructions (Signed)
Nice to see you. If you would like to have an ultrasound or urine protein testing please let us know. I would suggest follow-up with GI given your persistent reflux symptoms to consider an EGD.

## 2018-04-09 NOTE — Progress Notes (Signed)
  Tommi Rumps, MD Phone: [phone]  Katelyn Knight is a 72 y.o. female who presents today for f/u.  CC: hld, htn, gerd, ckd stage 3  HYPERLIPIDEMIA Symptoms Chest pain on exertion:  no   Medications: Compliance- taking crestor Right upper quadrant pain- no  Muscle aches- no  HYPERTENSION  Disease Monitoring  Home BP Monitoring similar to today Chest pain- no    Dyspnea- no Medications  Compliance-  Taking benicar.  Edema- chronic in the AM, stable and unchanged, no orthopnea or PND  GERD:   Reflux symptoms: occasional   Abd pain: no   Blood in stool: no  Dysphagia: occasionally feels like food sticks, has been this way for 20 years   EGD: reports prior EGD  Medication: dexilant  CKD stage III: Noted on lab work.  Kidney function is actually slightly improved compared to previously.  No hematuria.  No foamy urine.  No NSAID use.     Social History   Tobacco Use  Smoking Status Never Smoker  Smokeless Tobacco Never Used     ROS see history of present illness  Objective  Physical Exam Vitals:   04/09/18 1314  BP: 100/62  Pulse: (!) 57  Temp: 97.8 F (36.6 C)  SpO2: 97%    BP Readings from Last 3 Encounters:  04/09/18 100/62  11/28/17 120/80  08/21/17 120/68   Wt Readings from Last 3 Encounters:  04/09/18 144 lb 6.4 oz (65.5 kg)  11/28/17 146 lb (66.2 kg)  08/21/17 146 lb 3.2 oz (66.3 kg)    Physical Exam  Constitutional: No distress.  Cardiovascular: Normal rate, regular rhythm and normal heart sounds.  Pulmonary/Chest: Effort normal and breath sounds normal.  Abdominal: Soft. Bowel sounds are normal. She exhibits no distension. There is no tenderness. There is no rebound and no guarding.  Musculoskeletal: She exhibits no edema.  Neurological: She is alert.  Skin: Skin is warm and dry. She is not diaphoretic.     Assessment/Plan: Please see individual problem list.  Hypertension Well-controlled.  Continue current  regimen.  Hyperlipidemia Adequately controlled.  Continue current regimen.  GERD (gastroesophageal reflux disease) Continues to have some symptoms.  She will continue Dexilant.  Advised referral back to GI.  Chronic kidney disease (CKD), stage III (moderate) (HCC) Has been stable.  Continue to monitor periodically.  She has declined renal ultrasound and urine protein testing.  She will avoid NSAIDs.    Orders Placed This Encounter  Procedures  . Ambulatory referral to Gastroenterology    Referral Priority:   Routine    Referral Type:   Consultation    Referral Reason:   Specialty Services Required    Number of Visits Requested:   1    No orders of the defined types were placed in this encounter.    Tommi Rumps, MD [place]

## 2018-04-09 NOTE — Assessment & Plan Note (Signed)
Well-controlled.  Continue current regimen. 

## 2018-04-09 NOTE — Assessment & Plan Note (Signed)
Has been stable.  Continue to monitor periodically.  She has declined renal ultrasound and urine protein testing.  She will avoid NSAIDs.

## 2018-04-09 NOTE — Assessment & Plan Note (Signed)
Continues to have some symptoms.  She will continue Dexilant.  Advised referral back to GI.

## 2018-04-09 NOTE — Assessment & Plan Note (Signed)
Adequately controlled.  Continue current regimen. 

## 2018-04-17 ENCOUNTER — Telehealth: Payer: Self-pay

## 2018-04-17 NOTE — Telephone Encounter (Signed)
Copied from [place] [id number]. Topic: Referral - Status >> Apr 17, 2018  9:15 AM Margot Ables wrote: Reason for CRM: pt calling to f/u on GI referral. She noted wanting to see Dr. Vira Agar at [facility]. Please follow up with pt as it does not appear referral has been routed to GI.

## 2018-04-26 ENCOUNTER — Other Ambulatory Visit: Payer: Self-pay | Admitting: Family Medicine

## 2018-05-12 DIAGNOSIS — K219 Gastro-esophageal reflux disease without esophagitis: Secondary | ICD-10-CM | POA: Diagnosis not present

## 2018-07-03 ENCOUNTER — Encounter: Payer: Self-pay | Admitting: Family Medicine

## 2018-07-03 ENCOUNTER — Ambulatory Visit (INDEPENDENT_AMBULATORY_CARE_PROVIDER_SITE_OTHER): Payer: Medicare Other | Admitting: Family Medicine

## 2018-07-03 VITALS — BP 112/72 | HR 78 | Temp 98.7°F | Resp 16 | Ht 65.0 in | Wt 148.0 lb

## 2018-07-03 DIAGNOSIS — J208 Acute bronchitis due to other specified organisms: Secondary | ICD-10-CM

## 2018-07-03 DIAGNOSIS — R05 Cough: Secondary | ICD-10-CM

## 2018-07-03 DIAGNOSIS — R059 Cough, unspecified: Secondary | ICD-10-CM

## 2018-07-03 MED ORDER — METHYLPREDNISOLONE ACETATE 40 MG/ML IJ SUSP
40.0000 mg | Freq: Once | INTRAMUSCULAR | Status: AC
  Administered 2018-07-03: 40 mg via INTRAMUSCULAR

## 2018-07-03 MED ORDER — BENZONATATE 100 MG PO CAPS: 100.0000 mg | ORAL_CAPSULE | Freq: Three times a day (TID) | ORAL | 0 refills | Status: DC | PRN

## 2018-07-03 MED ORDER — ALBUTEROL SULFATE (2.5 MG/3ML) 0.083% IN NEBU
2.5000 mg | INHALATION_SOLUTION | Freq: Once | RESPIRATORY_TRACT | Status: AC
  Administered 2018-07-03: 2.5 mg via RESPIRATORY_TRACT

## 2018-07-03 MED ORDER — IPRATROPIUM BROMIDE 0.02 % IN SOLN
0.5000 mg | Freq: Once | RESPIRATORY_TRACT | Status: AC
  Administered 2018-07-03: 0.5 mg via RESPIRATORY_TRACT

## 2018-07-03 MED ORDER — ALBUTEROL SULFATE HFA 108 (90 BASE) MCG/ACT IN AERS: 2.0000 | INHALATION_SPRAY | Freq: Four times a day (QID) | RESPIRATORY_TRACT | 0 refills | Status: DC | PRN

## 2018-07-03 NOTE — Progress Notes (Signed)
Subjective:    Patient ID: Katelyn Knight, female    DOB: Sep 14, 1945, 73 y.o.   MRN: [id number]  HPI   Patient resents clinic complaining of cough, runny nose with clear drainage, fullness in ears for the past 4 to 5 days.  Patient states she was outdoors weed whacking and believes that this could have set off her allergies.  States she feels a little short of breath and wheezy at times, more so in the evening when she tried to lay down to get to sleep.  She has been using over-the-counter cough syrup and Claritin with decent effect in helping to reduce congestion and cough.  Denies fever or chills.  Denies chest pain.  Denies nausea/vomiting or diarrhea.  Denies body aches.  Patient Active Problem List   Diagnosis Date Noted  . Chronic kidney disease (CKD), stage III (moderate) (HCC) 04/09/2018  . History of mitral valve prolapse 11/28/2017  . Knee pain 08/21/2017  . Epigastric pain 01/22/2017  . Decreased energy 01/22/2017  . 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 Dupuytren's contracture of both hands 06/04/2012  . 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 Depression 07/11/2011  . Hypertension 05/30/2011  . Hyperlipidemia 05/30/2011  . Neuropathy 05/30/2011  . GERD (gastroesophageal reflux disease) 05/30/2011   Social History   Tobacco Use  . Smoking status: Never Smoker  . Smokeless tobacco: Never Used  Substance Use Topics  . Alcohol use: Yes    Comment: Occasional   Review of Systems   Constitutional: Negative for chills, fatigue and fever.  HENT: +congestion, ear pain, sinus pain . No sore throat.    Eyes: Negative.   Respiratory: +cough. Some sob and wheeze at night when laying down   Cardiovascular: Negative for chest pain, palpitations and leg swelling.  Gastrointestinal: Negative for abdominal pain, diarrhea, nausea and vomiting.  Genitourinary: Negative for dysuria, frequency and urgency.  Musculoskeletal: Negative for  arthralgias and myalgias.  Skin: Negative for color change, pallor and rash.  Neurological: Negative for syncope, light-headedness and headaches.  Psychiatric/Behavioral: The patient is not nervous/anxious.       Objective:   Physical Exam Vitals signs and nursing note reviewed.  Constitutional:      General: She is not in acute distress.    Appearance: She is not toxic-appearing.  HENT:     Head: Normocephalic and atraumatic.     Right Ear: External ear normal.     Left Ear: External ear normal.     Ears:     Comments: Fullness bilat TMs    Nose: Congestion and rhinorrhea (clear drainage) present.     Mouth/Throat:     Mouth: Mucous membranes are moist.     Pharynx: No oropharyngeal exudate or posterior oropharyngeal erythema.  Eyes:     General: No scleral icterus.    Extraocular Movements: Extraocular movements intact.     Conjunctiva/sclera: Conjunctivae normal.  Neck:     Musculoskeletal: Neck supple. No neck rigidity.  Cardiovascular:     Rate and Rhythm: Normal rate and regular rhythm.  Pulmonary:     Effort: Pulmonary effort is normal. No respiratory distress.     Breath sounds: Wheezing (faint expiratory wheezes) present. No rhonchi or rales.  Lymphadenopathy:     Cervical: No cervical adenopathy.  Skin:    General: Skin is warm and dry.     Coloration: Skin is not jaundiced or pale.  Neurological:     Mental Status:  She is alert and oriented to person, place, and time.  Psychiatric:        Mood and Affect: Mood normal.        Behavior: Behavior normal.    Vitals:   07/03/18 0846  BP: 112/72  Pulse: 78  Resp: 16  Temp: 98.7 F (37.1 C)  SpO2: 95%      Assessment & Plan:   Viral bronchitis/cough -- patient given a DuoNeb treatment in clinic and IM methylprednisolone 40 mg x 1.  Suspect her cough and congestion is related to a viral URI/viral bronchitis.  Also could have been exacerbated by being outdoors and weed whacking.  Advised she can continue to  use the Claritin as she has been to help dry up congestion and used Robitussin cough syrup.  Tessalon Perles sent in as an alternative option to Robitussin cough syrup to help reduce cough symptoms.  Albuterol inhaler scented to use if needed for any shortness of breath or wheezing.    Patient advised that her symptoms should slowly begin to resolve by the end of this week.  If her cough, breathing symptoms worsen she is to call office right away and return for reevaluation at that time.

## 2018-07-03 NOTE — Patient Instructions (Signed)

## 2018-07-21 ENCOUNTER — Other Ambulatory Visit: Payer: Self-pay | Admitting: Family Medicine

## 2018-07-21 DIAGNOSIS — R059 Cough, unspecified: Secondary | ICD-10-CM

## 2018-07-21 DIAGNOSIS — J208 Acute bronchitis due to other specified organisms: Secondary | ICD-10-CM

## 2018-07-21 DIAGNOSIS — R05 Cough: Secondary | ICD-10-CM

## 2018-07-24 DIAGNOSIS — K2950 Unspecified chronic gastritis without bleeding: Secondary | ICD-10-CM | POA: Diagnosis not present

## 2018-07-24 DIAGNOSIS — K3189 Other diseases of stomach and duodenum: Secondary | ICD-10-CM | POA: Diagnosis not present

## 2018-07-24 DIAGNOSIS — K210 Gastro-esophageal reflux disease with esophagitis: Secondary | ICD-10-CM | POA: Diagnosis not present

## 2018-07-25 DIAGNOSIS — K3189 Other diseases of stomach and duodenum: Secondary | ICD-10-CM | POA: Diagnosis not present

## 2018-07-25 DIAGNOSIS — K210 Gastro-esophageal reflux disease with esophagitis: Secondary | ICD-10-CM | POA: Diagnosis not present

## 2018-07-25 DIAGNOSIS — K2950 Unspecified chronic gastritis without bleeding: Secondary | ICD-10-CM | POA: Diagnosis not present

## 2018-07-25 DIAGNOSIS — K2970 Gastritis, unspecified, without bleeding: Secondary | ICD-10-CM | POA: Diagnosis not present

## 2018-08-05 ENCOUNTER — Other Ambulatory Visit: Payer: Self-pay | Admitting: Family Medicine

## 2018-08-08 DIAGNOSIS — K2950 Unspecified chronic gastritis without bleeding: Secondary | ICD-10-CM | POA: Diagnosis not present

## 2018-09-23 ENCOUNTER — Other Ambulatory Visit: Payer: Self-pay | Admitting: Family Medicine

## 2018-10-09 ENCOUNTER — Other Ambulatory Visit: Payer: Self-pay

## 2018-10-09 ENCOUNTER — Telehealth: Payer: Self-pay | Admitting: Family Medicine

## 2018-10-13 ENCOUNTER — Other Ambulatory Visit: Payer: Self-pay

## 2018-10-13 ENCOUNTER — Ambulatory Visit (INDEPENDENT_AMBULATORY_CARE_PROVIDER_SITE_OTHER): Payer: Medicare Other | Admitting: Family Medicine

## 2018-10-13 ENCOUNTER — Encounter: Payer: Self-pay | Admitting: Family Medicine

## 2018-10-13 VITALS — BP 156/64 | HR 60 | Temp 98.3°F | Ht 65.0 in | Wt 150.2 lb

## 2018-10-13 DIAGNOSIS — E785 Hyperlipidemia, unspecified: Secondary | ICD-10-CM | POA: Diagnosis not present

## 2018-10-13 DIAGNOSIS — Z1239 Encounter for other screening for malignant neoplasm of breast: Secondary | ICD-10-CM

## 2018-10-13 DIAGNOSIS — I10 Essential (primary) hypertension: Secondary | ICD-10-CM

## 2018-10-13 DIAGNOSIS — Z0001 Encounter for general adult medical examination with abnormal findings: Secondary | ICD-10-CM | POA: Diagnosis not present

## 2018-10-13 DIAGNOSIS — M79602 Pain in left arm: Secondary | ICD-10-CM | POA: Insufficient documentation

## 2018-10-13 DIAGNOSIS — R1013 Epigastric pain: Secondary | ICD-10-CM

## 2018-10-13 DIAGNOSIS — Z1329 Encounter for screening for other suspected endocrine disorder: Secondary | ICD-10-CM | POA: Diagnosis not present

## 2018-10-13 DIAGNOSIS — Z130 Encounter for screening for diseases of the blood and blood-forming organs and certain disorders involving the immune mechanism: Secondary | ICD-10-CM

## 2018-10-13 DIAGNOSIS — R413 Other amnesia: Secondary | ICD-10-CM | POA: Insufficient documentation

## 2018-10-13 DIAGNOSIS — F419 Anxiety disorder, unspecified: Secondary | ICD-10-CM | POA: Insufficient documentation

## 2018-10-13 LAB — COMPREHENSIVE METABOLIC PANEL
ALT: 14 U/L (ref 0–35)
AST: 24 U/L (ref 0–37)
Albumin: 4.2 g/dL (ref 3.5–5.2)
Alkaline Phosphatase: 107 U/L (ref 39–117)
BUN: 16 mg/dL (ref 6–23)
CO2: 29 mEq/L (ref 19–32)
Calcium: 9.6 mg/dL (ref 8.4–10.5)
Chloride: 98 mEq/L (ref 96–112)
Creatinine, Ser: 0.96 mg/dL (ref 0.40–1.20)
GFR: 56.63 mL/min — ABNORMAL LOW (ref >60.00)
Glucose, Bld: 94 mg/dL (ref 70–99)
Potassium: 4.5 mEq/L (ref 3.5–5.1)
Sodium: 134 mEq/L — ABNORMAL LOW (ref 135–145)
Total Bilirubin: 0.6 mg/dL (ref 0.2–1.2)
Total Protein: 6.4 g/dL (ref 6.0–8.3)

## 2018-10-13 LAB — LIPID PANEL
Cholesterol: 230 mg/dL — ABNORMAL HIGH (ref 0–200)
HDL: 63.5 mg/dL (ref >39.00)
LDL Cholesterol: 142 mg/dL — ABNORMAL HIGH (ref 0–99)
NonHDL: 166.61
Total CHOL/HDL Ratio: 4
Triglycerides: 123 mg/dL (ref 0.0–149.0)
VLDL: 24.6 mg/dL (ref 0.0–40.0)

## 2018-10-13 LAB — CBC
HCT: 40.9 % (ref 36.0–46.0)
Hemoglobin: 14.4 g/dL (ref 12.0–15.0)
MCHC: 35.1 g/dL (ref 30.0–36.0)
MCV: 91.6 fl (ref 78.0–100.0)
Platelets: 163 10*3/uL (ref 150.0–400.0)
RBC: 4.47 Mil/uL (ref 3.87–5.11)
RDW: 13.5 % (ref 11.5–15.5)
WBC: 3.8 10*3/uL — ABNORMAL LOW (ref 4.0–10.5)

## 2018-10-13 LAB — TSH: TSH: 2.78 u[IU]/mL (ref 0.35–4.50)

## 2018-10-13 MED ORDER — ESCITALOPRAM OXALATE 10 MG PO TABS: 10.0000 mg | ORAL_TABLET | Freq: Every day | ORAL | 1 refills | Status: DC

## 2018-10-13 NOTE — Progress Notes (Signed)
Pre visit review using our clinic review tool, if applicable. No additional management support is needed unless otherwise documented below in the visit note. 

## 2018-10-13 NOTE — Assessment & Plan Note (Signed)
Physical exam completed.  Encouraged healthy diet and increased activity.  Mammogram ordered and patient will call to schedule this.  Patient declined colon cancer screening.  I did discuss the risk of not completing colon cancer screening and she verbalized understanding.  She declined hepatitis C screening.  She declined breast and pelvic exam.  She declines Shingrix.  Lab work as outlined below.

## 2018-10-13 NOTE — Progress Notes (Signed)
Tommi Rumps, MD Phone: [phone]  Katelyn Knight is a 73 y.o. female who presents today for CPE.  Walks about a quarter of a mile every other day with her dog.  Does do some yard work. She has been eating lots of peanut butter and banana sandwiches, cold cuts, oatmeal, and fruit.  Some sweet potatoes. Mammogram is due on 12/04/2018.  She will call to schedule this.  She has a history of breast cancer.  She notes no new lesions.  She declines breast exam today. Colon cancer screening: Patient reports she has been unable to have colonoscopies given tortuous colon.  She does report having completed Cologuard though notes she will not do that again either.  She defers colon cancer screening at this time and understands the risks. She is status post hysterectomy.  She reports they possibly saw some precancerous cells in the 1980s.  She notes she has not had any Pap smear since then and defers pelvic exam. She denies family history of colon cancer and breast cancer.  She denies family history of ovarian cancer. Up-to-date on tetanus vaccine and pneumonia vaccine.  Discussed Shingrix though she declines. She declines bone density scan. No tobacco use, alcohol use, or illicit drug use. She does see a dentist and an ophthalmologist.  Abdominal pain: Patient continues to have intermittent issues with epigastric abdominal discomfort.  Does note some reflux as well.  She continues on Dexilant.  She saw GI and underwent an endoscopy which revealed some gastritis.  She saw them in follow-up and they recommended that she trial antacids or Carafate.  She does take Tums 6 a day.  She has taken Dexilant 30 minutes prior to eating.  She recently quit her statin as she thought that may be contributing to these symptoms.  She did this 3 to 4 days ago so does not know if it has helped.  Anxiety: Patient does report some anxiety in dealing with her husband who she believes may be bipolar.  She thinks she  potentially is reacting anxiously to this.  She notes no depression.  She does feel safe at home.  No HI or SI.  Memory issues: Reports some mild memory issues with forgetting what she came into her room to get.  No ADL or IADL issues.  No issues with getting lost.  No significant issues with remembering names.  Urine incontinence: Patient notes chronic intermittent urinary frequency and incontinence if she drinks too much fluid.  No changes in this.  Left arm pain: Patient notes she has had some discomfort in the muscles in her left arm since she got her flu shot last fall.  She notes it only bothers her if she picks her arm up at a certain angle.  No injury.  Only occurs once in a while.  No specific location of the discomfort.  It can occur anywhere in her arm.  Active Ambulatory Problems    Diagnosis Date Noted  . Hypertension 05/30/2011  . Hyperlipidemia 05/30/2011  . Neuropathy 05/30/2011  . GERD (gastroesophageal reflux disease) 05/30/2011  . Depression 07/11/2011  . Dupuytren's contracture of both hands 06/04/2012  . 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 Encounter for general adult medical examination with abnormal findings 12/30/2014  . Epigastric pain 01/22/2017  . Decreased energy 01/22/2017  . Knee pain 08/21/2017  . History of mitral valve prolapse 11/28/2017  . Chronic kidney disease (CKD), stage  III (moderate) ([facility]) 04/09/2018  . Anxiety 10/13/2018  . Left arm pain 10/13/2018  . Memory difficulty 10/13/2018   Resolved Ambulatory Problems    Diagnosis Date Noted  . Medicare annual wellness visit, subsequent 06/04/2012  . Pain of skin 09/19/2012  . Dysuria 11/20/2012  . Bilateral groin pain 03/03/2013  . Need for prophylactic vaccination and inoculation against influenza 03/03/2013  . Need for prophylactic vaccination against Streptococcus pneumoniae (pneumococcus) 03/03/2013   Past Medical  History:  Diagnosis Date  . Breast cancer ([facility]) 2007  . HTN (hypertension)   . Menopausal symptom   . Osteoporosis   . Personal history of radiation therapy 2007  . Shingles 2014  . URI (upper respiratory infection)     Family History  Problem Relation Age of Onset  . Breast cancer Neg Hx     Social History   Socioeconomic History  . Marital status: Married    Spouse name: Not on file  . Number of children: 2  . Years of education: Not on file  . Highest education level: Not on file  Occupational History  . Occupation: Retired - Office/[employer]  Social Needs  . Financial resource strain: Not on file  . Food insecurity:    Worry: Not on file    Inability: Not on file  . Transportation needs:    Medical: Not on file    Non-medical: Not on file  Tobacco Use  . Smoking status: Never Smoker  . Smokeless tobacco: Never Used  Substance and Sexual Activity  . Alcohol use: Yes    Comment: Occasional  . Drug use: No  . Sexual activity: Not on file  Lifestyle  . Physical activity:    Days per week: Not on file    Minutes per session: Not on file  . Stress: Not on file  Relationships  . Social connections:    Talks on phone: Not on file    Gets together: Not on file    Attends religious service: Not on file    Active member of club or organization: Not on file    Attends meetings of clubs or organizations: Not on file    Relationship status: Not on file  . Intimate partner violence:    Fear of current or ex partner: Not on file    Emotionally abused: Not on file    Physically abused: Not on file    Forced sexual activity: Not on file  Other Topics Concern  . Not on file  Social History Narrative   Regular Exercise -  Not at this time   Daily Caffeine Use:  Diet Dr Malachi Bonds x1, Coffee x 1 cup    ROS  General:  Negative for nexplained weight loss, fever Skin: Negative for new or changing mole, sore that won't heal HEENT: Negative for trouble hearing, trouble seeing,  ringing in ears, mouth sores, hoarseness, change in voice, dysphagia. CV:  Negative for chest pain, dyspnea, edema, palpitations Resp: Negative for cough, dyspnea, hemoptysis GI: Positive for abdominal pain, negative for nausea, vomiting, diarrhea, constipation, melena, hematochezia. GU: Positive for urinary incontinence, and frequent urination, negative for dysuria, urinary hesitance, hematuria, vaginal or penile discharge, sexual difficulty, lumps in testicle or breasts MSK: Positive for muscle cramps or aches, negative for joint pain or swelling Neuro: Negative for headaches, weakness, numbness, dizziness, passing out/fainting Psych: Negative for depression, anxiety, memory problems  Objective  Physical Exam Vitals:   10/13/18 0840  BP: (!) 156/64  Pulse: 60  Temp:  98.3 F (36.8 C)  SpO2: 96%    BP Readings from Last 3 Encounters:  10/13/18 (!) 156/64  07/03/18 112/72  04/09/18 100/62   Wt Readings from Last 3 Encounters:  10/13/18 150 lb 3.2 oz (68.1 kg)  07/03/18 148 lb (67.1 kg)  04/09/18 144 lb 6.4 oz (65.5 kg)    Physical Exam Constitutional:      General: She is not in acute distress.    Appearance: She is not diaphoretic.  HENT:     Head: Normocephalic and atraumatic.     Mouth/Throat:     Mouth: Mucous membranes are moist.  Eyes:     Conjunctiva/sclera: Conjunctivae normal.     Pupils: Pupils are equal, round, and reactive to light.  Cardiovascular:     Rate and Rhythm: Normal rate and regular rhythm.     Heart sounds: Normal heart sounds.  Pulmonary:     Effort: Pulmonary effort is normal.     Breath sounds: Normal breath sounds.  Abdominal:     General: Bowel sounds are normal. There is no distension.     Palpations: Abdomen is soft. There is no mass.     Tenderness: There is no abdominal tenderness.  Musculoskeletal:     Right lower leg: No edema.     Left lower leg: No edema.     Comments: Left upper extremity with no muscular tenderness, there  is no tenderness of her shoulder or elbow, appears symmetric compared to right upper extremity, full range of motion left shoulder with no discomfort actively or passively  Lymphadenopathy:     Cervical: No cervical adenopathy.  Skin:    General: Skin is warm and dry.  Neurological:     General: No focal deficit present.     Mental Status: She is alert.      Assessment/Plan:   Encounter for general adult medical examination with abnormal findings Physical exam completed.  Encouraged healthy diet and increased activity.  Mammogram ordered and patient will call to schedule this.  Patient declined colon cancer screening.  I did discuss the risk of not completing colon cancer screening and she verbalized understanding.  She declined hepatitis C screening.  She declined breast and pelvic exam.  She declines Shingrix.  Lab work as outlined below.  Anxiety Patient does have some anxiety.  We will transition her from Prozac to Lexapro as this will provide better coverage for anxiety and depression.  She will discontinue the Prozac for 7 days and then start on the Lexapro.  She will follow-up in 3 months.  Epigastric pain This continues to be an issue.  She has been evaluated by GI.  This possibly could be related to gastritis.  Offered trial of Carafate to be taken in addition to the Dexilant that she is currently on though she declined this and opted to continue with Dexilant and Tums.  Hypertension Systolic blood pressure elevated today though diastolic is borderline low and limits the ability to alter her medications.  Plan to recheck in 3 months.  She will continue her current regimen.  Left arm pain This potentially could be a muscular or joint issue.  Given that it occurs infrequently she will monitor for now.  If it becomes more bothersome or she develops any new symptoms she will be evaluated.  Memory difficulty Reported as mild.  Given that it typically only occurs with her forgetting  where she came into her room to get she will monitor for now.  Orders Placed This Encounter  Procedures  . MM 3D SCREEN BREAST BILATERAL    Standing Status:   Future    Standing Expiration Date:   12/13/2019    Order Specific Question:   Reason for Exam (SYMPTOM  OR DIAGNOSIS REQUIRED)    Answer:   breast cancer screening    Order Specific Question:   Preferred imaging location?    Answer:   [facility]  . CBC  . Lipid panel  . Comp Met (CMET)  . TSH    Meds ordered this encounter  Medications  . escitalopram (LEXAPRO) 10 MG tablet    Sig: Take 1 tablet (10 mg total) by mouth daily.    Dispense:  90 tablet    Refill:  1    Discontinue prozac     Tommi Rumps, MD [place]

## 2018-10-13 NOTE — Assessment & Plan Note (Signed)
This continues to be an issue.  She has been evaluated by GI.  This possibly could be related to gastritis.  Offered trial of Carafate to be taken in addition to the Dexilant that she is currently on though she declined this and opted to continue with Dexilant and Tums.

## 2018-10-13 NOTE — Assessment & Plan Note (Signed)
Systolic blood pressure elevated today though diastolic is borderline low and limits the ability to alter her medications.  Plan to recheck in 3 months.  She will continue her current regimen.

## 2018-10-13 NOTE — Assessment & Plan Note (Signed)
This potentially could be a muscular or joint issue.  Given that it occurs infrequently she will monitor for now.  If it becomes more bothersome or she develops any new symptoms she will be evaluated.

## 2018-10-13 NOTE — Assessment & Plan Note (Signed)
Reported as mild.  Given that it typically only occurs with her forgetting where she came into her room to get she will monitor for now.

## 2018-10-13 NOTE — Assessment & Plan Note (Signed)
Patient does have some anxiety.  We will transition her from Prozac to Lexapro as this will provide better coverage for anxiety and depression.  She will discontinue the Prozac for 7 days and then start on the Lexapro.  She will follow-up in 3 months.

## 2018-10-13 NOTE — Patient Instructions (Signed)
Nice to see you. We will get lab work today and contact you with the results. Please continue to stay active and monitor your diet. Please call to schedule your mammogram sometime in July. If you change your mind regarding Shingrix or colonoscopy please let us know. We will get lab work today and contact you with the results. Please continue your medication for your reflux.  If you would like to see a different GI physician for a second opinion we can try that.  We can additionally try Carafate if you would prefer that as well. Please discontinue the Prozac and then 7 days later started on the Lexapro.

## 2018-10-14 ENCOUNTER — Other Ambulatory Visit: Payer: Self-pay | Admitting: Family Medicine

## 2018-10-14 DIAGNOSIS — D72819 Decreased white blood cell count, unspecified: Secondary | ICD-10-CM

## 2018-10-29 ENCOUNTER — Other Ambulatory Visit: Payer: Self-pay | Admitting: Family Medicine

## 2018-10-30 ENCOUNTER — Encounter: Payer: Self-pay | Admitting: Family Medicine

## 2018-10-30 ENCOUNTER — Other Ambulatory Visit: Payer: Self-pay

## 2018-10-30 NOTE — Patient Outreach (Signed)
[facility]) Care Management  10/30/2018  Katelyn Knight [birth year] [id number]   Medication Adherence call to Mrs. Katelyn Knight HIPPA Compliant Voice message left with a call back number. [name] is showing past due on Rosuvastatin 10 mg and Losartan/Hctz 100/25 mg under [place].   [place] Management Direct Dial [phone]  Fax [phone] [email or web address]

## 2018-11-05 ENCOUNTER — Other Ambulatory Visit: Payer: Self-pay

## 2018-11-05 ENCOUNTER — Other Ambulatory Visit (INDEPENDENT_AMBULATORY_CARE_PROVIDER_SITE_OTHER): Payer: Medicare Other

## 2018-11-05 DIAGNOSIS — D72819 Decreased white blood cell count, unspecified: Secondary | ICD-10-CM | POA: Diagnosis not present

## 2018-11-05 LAB — CBC WITH DIFFERENTIAL/PLATELET
Basophils Absolute: 0 10*3/uL (ref 0.0–0.1)
Basophils Relative: 0.8 % (ref 0.0–3.0)
Eosinophils Absolute: 0.1 10*3/uL (ref 0.0–0.7)
Eosinophils Relative: 3 % (ref 0.0–5.0)
HCT: 40 % (ref 36.0–46.0)
Hemoglobin: 13.5 g/dL (ref 12.0–15.0)
Lymphocytes Relative: 23.5 % (ref 12.0–46.0)
Lymphs Abs: 0.9 10*3/uL (ref 0.7–4.0)
MCHC: 33.9 g/dL (ref 30.0–36.0)
MCV: 92.2 fl (ref 78.0–100.0)
Monocytes Absolute: 0.5 10*3/uL (ref 0.1–1.0)
Monocytes Relative: 13.4 % — ABNORMAL HIGH (ref 3.0–12.0)
Neutro Abs: 2.2 10*3/uL (ref 1.4–7.7)
Neutrophils Relative %: 59.3 % (ref 43.0–77.0)
Platelets: 159 10*3/uL (ref 150.0–400.0)
RBC: 4.34 Mil/uL (ref 3.87–5.11)
RDW: 13 % (ref 11.5–15.5)
WBC: 3.8 10*3/uL — ABNORMAL LOW (ref 4.0–10.5)

## 2018-11-06 ENCOUNTER — Encounter: Payer: Self-pay | Admitting: Family Medicine

## 2018-11-06 ENCOUNTER — Other Ambulatory Visit: Payer: Self-pay | Admitting: Family Medicine

## 2018-11-06 DIAGNOSIS — D72819 Decreased white blood cell count, unspecified: Secondary | ICD-10-CM

## 2018-12-02 ENCOUNTER — Other Ambulatory Visit: Payer: Self-pay

## 2018-12-02 NOTE — Patient Outreach (Signed)
[facility]) Care Management  12/02/2018  Katelyn Knight Jun 14, 1945 [id number]   Medication Adherence call to Katelyn Knight HIPPA Compliant Voice message left with a call back number. Katelyn Knight is showing past due on Rosuvastatin 10 mg under [place].   [place] Management Direct Dial [phone]  Fax [phone] [email or web address]

## 2018-12-09 ENCOUNTER — Other Ambulatory Visit (INDEPENDENT_AMBULATORY_CARE_PROVIDER_SITE_OTHER): Payer: Medicare Other

## 2018-12-09 ENCOUNTER — Other Ambulatory Visit: Payer: Self-pay

## 2018-12-09 DIAGNOSIS — D72819 Decreased white blood cell count, unspecified: Secondary | ICD-10-CM | POA: Diagnosis not present

## 2018-12-09 LAB — CBC WITH DIFFERENTIAL/PLATELET
Basophils Absolute: 0 10*3/uL (ref 0.0–0.1)
Basophils Relative: 0.7 % (ref 0.0–3.0)
Eosinophils Absolute: 0.1 10*3/uL (ref 0.0–0.7)
Eosinophils Relative: 2.8 % (ref 0.0–5.0)
HCT: 41.6 % (ref 36.0–46.0)
Hemoglobin: 14.1 g/dL (ref 12.0–15.0)
Lymphocytes Relative: 26.7 % (ref 12.0–46.0)
Lymphs Abs: 0.9 10*3/uL (ref 0.7–4.0)
MCHC: 33.9 g/dL (ref 30.0–36.0)
MCV: 92.1 fl (ref 78.0–100.0)
Monocytes Absolute: 0.4 10*3/uL (ref 0.1–1.0)
Monocytes Relative: 12.2 % — ABNORMAL HIGH (ref 3.0–12.0)
Neutro Abs: 2 10*3/uL (ref 1.4–7.7)
Neutrophils Relative %: 57.6 % (ref 43.0–77.0)
Platelets: 167 10*3/uL (ref 150.0–400.0)
RBC: 4.52 Mil/uL (ref 3.87–5.11)
RDW: 12.9 % (ref 11.5–15.5)
WBC: 3.5 10*3/uL — ABNORMAL LOW (ref 4.0–10.5)

## 2018-12-18 ENCOUNTER — Other Ambulatory Visit: Payer: Self-pay

## 2018-12-18 NOTE — Patient Outreach (Signed)
[facility]) Care Management  12/18/2018  CHIZARAM LATINO 1946/04/08 [id number]   Medication Adherence call to Mrs. Jerzey Harriott [facility] Identifiers Verify spoke with patient she is past due on Rosuvastatin 10 mg patient explain she has stop taking this medication because she was having side effects. Mrs. Petit is showing past due under [place].   [place] Management Direct Dial [phone]  Fax [phone] [email or web address]

## 2018-12-19 ENCOUNTER — Ambulatory Visit
Admission: RE | Admit: 2018-12-19 | Discharge: 2018-12-19 | Disposition: A | Discharge status: Home / Self Care | Payer: Medicare Other | Source: Ambulatory Visit | Attending: Family Medicine | Admitting: Family Medicine

## 2018-12-19 ENCOUNTER — Other Ambulatory Visit: Payer: Self-pay

## 2018-12-19 DIAGNOSIS — Z1239 Encounter for other screening for malignant neoplasm of breast: Secondary | ICD-10-CM

## 2018-12-19 DIAGNOSIS — Z1231 Encounter for screening mammogram for malignant neoplasm of breast: Secondary | ICD-10-CM | POA: Insufficient documentation

## 2019-01-16 ENCOUNTER — Encounter: Payer: Self-pay | Admitting: Family Medicine

## 2019-01-16 ENCOUNTER — Telehealth: Payer: Self-pay | Admitting: *Deleted

## 2019-01-16 ENCOUNTER — Ambulatory Visit: Payer: Medicare Other | Admitting: Family Medicine

## 2019-01-16 DIAGNOSIS — Z0289 Encounter for other administrative examinations: Secondary | ICD-10-CM

## 2019-01-16 NOTE — Telephone Encounter (Signed)
Copied from [place] [phone]. Topic: General - Other >> Jan 16, 2019  8:58 AM Leward Quan A wrote: Reason for CRM: Patient called to say that she had a message from Butch Penny to call back she missed an appointment. Please call patient at Ph# [phone]

## 2019-03-06 ENCOUNTER — Other Ambulatory Visit: Payer: Self-pay

## 2019-03-09 ENCOUNTER — Ambulatory Visit (INDEPENDENT_AMBULATORY_CARE_PROVIDER_SITE_OTHER): Payer: Medicare Other | Admitting: Family Medicine

## 2019-03-09 ENCOUNTER — Encounter: Payer: Self-pay | Admitting: Family Medicine

## 2019-03-09 ENCOUNTER — Other Ambulatory Visit: Payer: Self-pay

## 2019-03-09 VITALS — BP 100/60 | HR 66 | Temp 97.6°F | Ht 65.0 in | Wt 154.2 lb

## 2019-03-09 DIAGNOSIS — R1013 Epigastric pain: Secondary | ICD-10-CM

## 2019-03-09 DIAGNOSIS — I10 Essential (primary) hypertension: Secondary | ICD-10-CM | POA: Diagnosis not present

## 2019-03-09 DIAGNOSIS — E785 Hyperlipidemia, unspecified: Secondary | ICD-10-CM

## 2019-03-09 LAB — COMPREHENSIVE METABOLIC PANEL
ALT: 16 U/L (ref 0–35)
AST: 27 U/L (ref 0–37)
Albumin: 4.2 g/dL (ref 3.5–5.2)
Alkaline Phosphatase: 105 U/L (ref 39–117)
BUN: 17 mg/dL (ref 6–23)
CO2: 30 mEq/L (ref 19–32)
Calcium: 9.6 mg/dL (ref 8.4–10.5)
Chloride: 99 mEq/L (ref 96–112)
Creatinine, Ser: 0.94 mg/dL (ref 0.40–1.20)
GFR: 57.96 mL/min — ABNORMAL LOW (ref >60.00)
Glucose, Bld: 97 mg/dL (ref 70–99)
Potassium: 3.9 mEq/L (ref 3.5–5.1)
Sodium: 136 mEq/L (ref 135–145)
Total Bilirubin: 0.5 mg/dL (ref 0.2–1.2)
Total Protein: 6.8 g/dL (ref 6.0–8.3)

## 2019-03-09 LAB — LDL CHOLESTEROL, DIRECT: Direct LDL: 158 mg/dL

## 2019-03-09 MED ORDER — LOSARTAN POTASSIUM-HCTZ 100-12.5 MG PO TABS: 1.0000 | ORAL_TABLET | Freq: Every day | ORAL | 1 refills | Status: DC

## 2019-03-09 NOTE — Assessment & Plan Note (Signed)
Well-controlled.  Check CMP.  Continue current regimen.

## 2019-03-09 NOTE — Assessment & Plan Note (Signed)
Encouraged continued exercise and monitoring her diet.  Discussed the potential for medication.  We will check her LDL.

## 2019-03-09 NOTE — Progress Notes (Signed)
Eric Sonnenberg, MD Phone: [phone]  Katelyn Knight is a 73 y.o. female who presents today for f/u.  HYPERTENSION  Disease Monitoring  Home BP Monitoring not checking at home Chest pain- no    Dyspnea- no Medications  Compliance-  Taking losartan/HCTZ.   Edema- no  Hyperlipidemia: She has gone back to the gym and is exercising day couple days a week.  She knows what she needs to do for her diet though she has a hard time given her abdominal pain issues.  She eats a lot of peanut butter and banana sandwiches and fruit.  Greens are difficult for her to eat with her GI issues.  Abdominal pain: Patient continues to have some epigastric abdominal discomfort with some reflux.  She notes this is actually quite a bit better though.  The Dexilant has not helped much and she is going to stop it when she finishes what she has.  She is taking Pepcid and a mixture of herbs that have been beneficial.  Does have some reflux symptoms.  No nausea, vomiting, diarrhea, constipation, or blood in her stool.  She does take Tums anywhere from 0-3 times daily.    Social History   Tobacco Use  Smoking Status Never Smoker  Smokeless Tobacco Never Used     ROS see history of present illness  Objective  Physical Exam Vitals:   03/09/19 0937  BP: 100/60  Pulse: 66  Temp: 97.6 F (36.4 C)  SpO2: 99%    BP Readings from Last 3 Encounters:  03/09/19 100/60  10/13/18 (!) 156/64  07/03/18 112/72   Wt Readings from Last 3 Encounters:  03/09/19 154 lb 3.2 oz (69.9 kg)  10/13/18 150 lb 3.2 oz (68.1 kg)  07/03/18 148 lb (67.1 kg)    Physical Exam Constitutional:      General: She is not in acute distress.    Appearance: She is not diaphoretic.  Cardiovascular:     Rate and Rhythm: Normal rate and regular rhythm.     Heart sounds: Normal heart sounds.  Pulmonary:     Effort: Pulmonary effort is normal.     Breath sounds: Normal breath sounds.  Abdominal:     General: Bowel sounds are  normal. There is no distension.     Palpations: Abdomen is soft.     Tenderness: There is no abdominal tenderness. There is no guarding or rebound.  Musculoskeletal:     Right lower leg: No edema.     Left lower leg: No edema.  Skin:    General: Skin is warm and dry.  Neurological:     Mental Status: She is alert.      Assessment/Plan: Please see individual problem list.  Hypertension Well-controlled.  Check CMP.  Continue current regimen.  Epigastric pain Overall improved though she continues to have intermittent symptoms with reflux.  Offered Carafate though she declined.  Offered referral for second opinion though she declined.  She will take the Dexilant every other day to complete her course and then come off of it.  If she has any recurrent symptoms she can go back on the Dexilant and we will refill this.  She will continue Pepcid.  Continue as needed Tums.  Hyperlipidemia Encouraged continued exercise and monitoring her diet.  Discussed the potential for medication.  We will check her LDL.   Orders Placed This Encounter  Procedures  . Comp Met (CMET)  . LDL cholesterol, direct    Meds ordered this encounter  Medications  .   losartan-hydrochlorothiazide (HYZAAR) 100-12.5 MG tablet    Sig: Take 1 tablet by mouth daily.    Dispense:  90 tablet    Refill:  1     Tommi Rumps, MD [place]

## 2019-03-09 NOTE — Patient Instructions (Signed)
Nice to see you. Please take the Dexilant every other day and then try to discontinue it.  If you have worsening issues with reflux or abdominal discomfort please let us know and I can refill the Dexilant. We will get lab work today and contact you with the results.

## 2019-03-09 NOTE — Assessment & Plan Note (Signed)
Overall improved though she continues to have intermittent symptoms with reflux.  Offered Carafate though she declined.  Offered referral for second opinion though she declined.  She will take the Dexilant every other day to complete her course and then come off of it.  If she has any recurrent symptoms she can go back on the [place] and we will refill this.  She will continue Pepcid.  Continue as needed Tums.

## 2019-03-18 DIAGNOSIS — H2513 Age-related nuclear cataract, bilateral: Secondary | ICD-10-CM | POA: Diagnosis not present

## 2019-03-18 DIAGNOSIS — H35371 Puckering of macula, right eye: Secondary | ICD-10-CM | POA: Diagnosis not present

## 2019-03-20 DIAGNOSIS — H2511 Age-related nuclear cataract, right eye: Secondary | ICD-10-CM | POA: Diagnosis not present

## 2019-03-20 DIAGNOSIS — I10 Essential (primary) hypertension: Secondary | ICD-10-CM | POA: Diagnosis not present

## 2019-03-23 ENCOUNTER — Encounter: Payer: Self-pay | Admitting: *Deleted

## 2019-03-23 ENCOUNTER — Other Ambulatory Visit: Payer: Self-pay

## 2019-03-27 ENCOUNTER — Other Ambulatory Visit: Payer: Self-pay

## 2019-03-27 ENCOUNTER — Other Ambulatory Visit
Admission: RE | Admit: 2019-03-27 | Discharge: 2019-03-27 | Disposition: A | Discharge status: Home / Self Care | Payer: Medicare Other | Source: Ambulatory Visit | Attending: Ophthalmology | Admitting: Ophthalmology

## 2019-03-27 DIAGNOSIS — Z01812 Encounter for preprocedural laboratory examination: Secondary | ICD-10-CM | POA: Diagnosis not present

## 2019-03-27 DIAGNOSIS — Z20828 Contact with and (suspected) exposure to other viral communicable diseases: Secondary | ICD-10-CM | POA: Diagnosis not present

## 2019-03-27 NOTE — Discharge Instructions (Signed)

## 2019-03-28 LAB — SARS CORONAVIRUS 2 (TAT 6-24 HRS): SARS Coronavirus 2: NEGATIVE

## 2019-03-30 NOTE — Anesthesia Preprocedure Evaluation (Addendum)
Anesthesia Evaluation  Patient identified by MRN, date of birth, ID band Patient awake    Reviewed: Allergy & Precautions, NPO status , Patient's Chart, lab work & pertinent test results  History of Anesthesia Complications Negative for: history of anesthetic complications  Airway Mallampati: II   Neck ROM: Full    Dental no notable dental hx.    Pulmonary neg pulmonary ROS,    Pulmonary exam normal breath sounds clear to auscultation       Cardiovascular hypertension, Normal cardiovascular exam Rhythm:Regular Rate:Normal     Neuro/Psych negative neurological ROS     GI/Hepatic GERD  ,  Endo/Other  negative endocrine ROS  Renal/GU negative Renal ROS     Musculoskeletal   Abdominal   Peds  Hematology Breast CA   Anesthesia Other Findings   Reproductive/Obstetrics                            Anesthesia Physical Anesthesia Plan  ASA: II  Anesthesia Plan: MAC   Post-op Pain Management:    Induction: Intravenous  PONV Risk Score and Plan: 2 and TIVA, Midazolam and Treatment may vary due to age or medical condition  Airway Management Planned: Natural Airway  Additional Equipment:   Intra-op Plan:   Post-operative Plan:   Informed Consent: I have reviewed the patients History and Physical, chart, labs and discussed the procedure including the risks, benefits and alternatives for the proposed anesthesia with the patient or authorized representative who has indicated his/her understanding and acceptance.       Plan Discussed with: CRNA  Anesthesia Plan Comments:        Anesthesia Quick Evaluation

## 2019-03-31 ENCOUNTER — Ambulatory Visit: Payer: Medicare Other | Admitting: Anesthesiology

## 2019-03-31 ENCOUNTER — Ambulatory Visit
Admission: RE | Admit: 2019-03-31 | Discharge: 2019-03-31 | Disposition: A | Discharge status: Home / Self Care | Payer: Medicare Other | Attending: Ophthalmology | Admitting: Ophthalmology

## 2019-03-31 ENCOUNTER — Other Ambulatory Visit: Payer: Self-pay

## 2019-03-31 ENCOUNTER — Encounter
Admission: RE | Disposition: A | Discharge status: Home / Self Care | Payer: Self-pay | Source: Home / Self Care | Attending: Ophthalmology

## 2019-03-31 DIAGNOSIS — F329 Major depressive disorder, single episode, unspecified: Secondary | ICD-10-CM | POA: Insufficient documentation

## 2019-03-31 DIAGNOSIS — I10 Essential (primary) hypertension: Secondary | ICD-10-CM | POA: Insufficient documentation

## 2019-03-31 DIAGNOSIS — Z853 Personal history of malignant neoplasm of breast: Secondary | ICD-10-CM | POA: Diagnosis not present

## 2019-03-31 DIAGNOSIS — Z79899 Other long term (current) drug therapy: Secondary | ICD-10-CM | POA: Insufficient documentation

## 2019-03-31 DIAGNOSIS — H2511 Age-related nuclear cataract, right eye: Secondary | ICD-10-CM | POA: Insufficient documentation

## 2019-03-31 DIAGNOSIS — K219 Gastro-esophageal reflux disease without esophagitis: Secondary | ICD-10-CM | POA: Diagnosis not present

## 2019-03-31 DIAGNOSIS — H25811 Combined forms of age-related cataract, right eye: Secondary | ICD-10-CM | POA: Diagnosis not present

## 2019-03-31 HISTORY — DX: Gastro-esophageal reflux disease without esophagitis: K21.9

## 2019-03-31 HISTORY — PX: CATARACT EXTRACTION W/PHACO: SHX586

## 2019-03-31 SURGERY — PHACOEMULSIFICATION, CATARACT, WITH IOL INSERTION
Anesthesia: Monitor Anesthesia Care | Site: Eye | Laterality: Right

## 2019-03-31 MED ORDER — MIDAZOLAM HCL 2 MG/2ML IJ SOLN
INTRAMUSCULAR | Status: DC | PRN
  Administered 2019-03-31: 1 mg via INTRAVENOUS

## 2019-03-31 MED ORDER — ACETAMINOPHEN 160 MG/5ML PO SOLN: 325.0000 mg | ORAL | Status: DC | PRN

## 2019-03-31 MED ORDER — ONDANSETRON HCL 4 MG/2ML IJ SOLN: 4.0000 mg | Freq: Once | INTRAMUSCULAR | Status: DC | PRN

## 2019-03-31 MED ORDER — BRIMONIDINE TARTRATE-TIMOLOL 0.2-0.5 % OP SOLN
OPHTHALMIC | Status: DC | PRN
  Administered 2019-03-31: 1 [drp] via OPHTHALMIC

## 2019-03-31 MED ORDER — ARMC OPHTHALMIC DILATING DROPS
1.0000 "application " | OPHTHALMIC | Status: DC | PRN
  Administered 2019-03-31 (×3): 1 via OPHTHALMIC

## 2019-03-31 MED ORDER — EPINEPHRINE PF 1 MG/ML IJ SOLN
INTRAOCULAR | Status: DC | PRN
  Administered 2019-03-31: 74 mL via OPHTHALMIC

## 2019-03-31 MED ORDER — TETRACAINE HCL 0.5 % OP SOLN
1.0000 [drp] | OPHTHALMIC | Status: DC | PRN
  Administered 2019-03-31 (×3): 1 [drp] via OPHTHALMIC

## 2019-03-31 MED ORDER — FENTANYL CITRATE (PF) 100 MCG/2ML IJ SOLN
INTRAMUSCULAR | Status: DC | PRN
  Administered 2019-03-31: 50 ug via INTRAVENOUS

## 2019-03-31 MED ORDER — NA CHONDROIT SULF-NA HYALURON 40-17 MG/ML IO SOLN
INTRAOCULAR | Status: DC | PRN
  Administered 2019-03-31: 1 mL via INTRAOCULAR

## 2019-03-31 MED ORDER — LIDOCAINE HCL (PF) 2 % IJ SOLN
INTRAOCULAR | Status: DC | PRN
  Administered 2019-03-31: 1 mL

## 2019-03-31 MED ORDER — ACETAMINOPHEN 325 MG PO TABS: 650.0000 mg | ORAL_TABLET | Freq: Once | ORAL | Status: DC | PRN

## 2019-03-31 MED ORDER — MOXIFLOXACIN HCL 0.5 % OP SOLN
OPHTHALMIC | Status: DC | PRN
  Administered 2019-03-31: 0.2 mL via OPHTHALMIC

## 2019-03-31 SURGICAL SUPPLY — 22 items
CANNULA ANT/CHMB 27G (MISCELLANEOUS) ×2 IMPLANT
CANNULA ANT/CHMB 27GA (MISCELLANEOUS) ×6 IMPLANT
GLOVE SURG LX 8.0 MICRO (GLOVE) ×4
GLOVE SURG LX STRL 8.0 MICRO (GLOVE) ×1 IMPLANT
GLOVE SURG TRIUMPH 8.0 PF LTX (GLOVE) ×3 IMPLANT
GOWN STRL REUS W/ TWL LRG LVL3 (GOWN DISPOSABLE) ×2 IMPLANT
GOWN STRL REUS W/TWL LRG LVL3 (GOWN DISPOSABLE) ×6
LENS IOL TECNIS ITEC 19.5 (Intraocular Lens) ×2 IMPLANT
MARKER SKIN DUAL TIP RULER LAB (MISCELLANEOUS) ×3 IMPLANT
NDL FILTER BLUNT 18X1 1/2 (NEEDLE) ×1 IMPLANT
NDL RETROBULBAR .5 NSTRL (NEEDLE) ×3 IMPLANT
NEEDLE FILTER BLUNT 18X 1/2SAF (NEEDLE) ×2
NEEDLE FILTER BLUNT 18X1 1/2 (NEEDLE) ×1 IMPLANT
PACK EYE AFTER SURG (MISCELLANEOUS) ×3 IMPLANT
PACK OPTHALMIC (MISCELLANEOUS) ×3 IMPLANT
PACK PORFILIO (MISCELLANEOUS) ×3 IMPLANT
SUT ETHILON 10-0 CS-B-6CS-B-6 (SUTURE)
SUTURE EHLN 10-0 CS-B-6CS-B-6 (SUTURE) IMPLANT
SYR 3ML LL SCALE MARK (SYRINGE) ×3 IMPLANT
SYR TB 1ML LUER SLIP (SYRINGE) ×3 IMPLANT
WATER STERILE IRR 250ML POUR (IV SOLUTION) ×3 IMPLANT
WIPE NON LINTING 3.25X3.25 (MISCELLANEOUS) ×3 IMPLANT

## 2019-03-31 NOTE — Op Note (Signed)
PREOPERATIVE DIAGNOSIS:  Nuclear sclerotic cataract of the right eye.   POSTOPERATIVE DIAGNOSIS:  Cataract   OPERATIVE PROCEDURE:@   SURGEON:  Birder Robson, MD.   ANESTHESIA:  Anesthesiologist: Darrin Nipper, MD CRNA: Silvana Newness, CRNA  1.      Managed anesthesia care. 2.      0.75ml of Shugarcaine was instilled in the eye following the paracentesis.   COMPLICATIONS:  None.   TECHNIQUE:   Stop and chop   DESCRIPTION OF PROCEDURE:  The patient was examined and consented in the preoperative holding area where the aforementioned topical anesthesia was applied to the right eye and then brought back to the Operating Room where the right eye was prepped and draped in the usual sterile ophthalmic fashion and a lid speculum was placed. A paracentesis was created with the side port blade and the anterior chamber was filled with viscoelastic. A near clear corneal incision was performed with the steel keratome. A continuous curvilinear capsulorrhexis was performed with a cystotome followed by the capsulorrhexis forceps. Hydrodissection and hydrodelineation were carried out with BSS on a blunt cannula. The lens was removed in a stop and chop  technique and the remaining cortical material was removed with the irrigation-aspiration handpiece. The capsular bag was inflated with viscoelastic and the Technis ZCB00  lens was placed in the capsular bag without complication. The remaining viscoelastic was removed from the eye with the irrigation-aspiration handpiece. The wounds were hydrated. The anterior chamber was flushed with BSS and the eye was inflated to physiologic pressure. 0.39ml of Vigamox was placed in the anterior chamber. The wounds were found to be water tight. The eye was dressed with Combigan. The patient was given protective glasses to wear throughout the day and a shield with which to sleep tonight. The patient was also given drops with which to begin a drop regimen today and will follow-up  with me in one day. Implant Name Type Inv. Item Serial No. Manufacturer Lot No. LRB No. Used Action  LENS IOL DIOP 19.5 - [id number] Intraocular Lens LENS IOL DIOP 19.5 [id number] AMO  Right 1 Implanted   Procedure(s) with comments: CATARACT EXTRACTION PHACO AND INTRAOCULAR LENS PLACEMENT (IOC) RIGHT (Right) - CDE 3.84 [place] 0:30.7  Electronically signed: Birder Robson 03/31/2019 11:34 AM

## 2019-03-31 NOTE — H&P (Signed)
All labs reviewed. Abnormal studies sent to patients PCP when indicated.  Previous H&P reviewed, patient examined, there are NO CHANGES.  Katelyn Casseus Porfilio11/17/202011:05 AM

## 2019-03-31 NOTE — Transfer of Care (Signed)
Immediate Anesthesia Transfer of Care Note  Patient: Katelyn Knight  Procedure(s) Performed: CATARACT EXTRACTION PHACO AND INTRAOCULAR LENS PLACEMENT ([facility]) RIGHT (Right Eye)  Patient Location: PACU  Anesthesia Type: MAC  Level of Consciousness: awake, alert  and patient cooperative  Airway and Oxygen Therapy: Patient Spontanous Breathing and Patient connected to supplemental oxygen  Post-op Assessment: Post-op Vital signs reviewed, Patient's Cardiovascular Status Stable, Respiratory Function Stable, Patent Airway and No signs of Nausea or vomiting  Post-op Vital Signs: Reviewed and stable  Complications: No apparent anesthesia complications

## 2019-03-31 NOTE — Anesthesia Postprocedure Evaluation (Signed)
Anesthesia Post Note  Patient: Katelyn Knight  Procedure(s) Performed: CATARACT EXTRACTION PHACO AND INTRAOCULAR LENS PLACEMENT (IOC) RIGHT (Right Eye)     Patient location during evaluation: PACU Anesthesia Type: MAC Level of consciousness: awake and alert, oriented and patient cooperative Pain management: pain level controlled Vital Signs Assessment: post-procedure vital signs reviewed and stable Respiratory status: spontaneous breathing, nonlabored ventilation and respiratory function stable Cardiovascular status: blood pressure returned to baseline and stable Postop Assessment: adequate PO intake Anesthetic complications: no    Darrin Nipper

## 2019-04-01 ENCOUNTER — Encounter: Payer: Self-pay | Admitting: Ophthalmology

## 2019-04-06 DIAGNOSIS — I10 Essential (primary) hypertension: Secondary | ICD-10-CM | POA: Diagnosis not present

## 2019-04-06 DIAGNOSIS — H2512 Age-related nuclear cataract, left eye: Secondary | ICD-10-CM | POA: Diagnosis not present

## 2019-04-15 ENCOUNTER — Other Ambulatory Visit: Payer: Self-pay

## 2019-04-17 ENCOUNTER — Other Ambulatory Visit
Admission: RE | Admit: 2019-04-17 | Discharge: 2019-04-17 | Disposition: A | Discharge status: Home / Self Care | Payer: Medicare Other | Source: Ambulatory Visit | Attending: Ophthalmology | Admitting: Ophthalmology

## 2019-04-17 DIAGNOSIS — Z20828 Contact with and (suspected) exposure to other viral communicable diseases: Secondary | ICD-10-CM | POA: Insufficient documentation

## 2019-04-17 DIAGNOSIS — Z01812 Encounter for preprocedural laboratory examination: Secondary | ICD-10-CM | POA: Insufficient documentation

## 2019-04-17 LAB — SARS CORONAVIRUS 2 (TAT 6-24 HRS): SARS Coronavirus 2: NEGATIVE

## 2019-04-20 NOTE — Anesthesia Preprocedure Evaluation (Addendum)
Anesthesia Evaluation  Patient identified by MRN, date of birth, ID band Patient awake    Reviewed: Allergy & Precautions, NPO status , Patient's Chart, lab work & pertinent test results  History of Anesthesia Complications Negative for: history of anesthetic complications  Airway Mallampati: II   Neck ROM: Full    Dental no notable dental hx.    Pulmonary neg pulmonary ROS,    Pulmonary exam normal breath sounds clear to auscultation       Cardiovascular hypertension, Normal cardiovascular exam Rhythm:Regular Rate:Normal     Neuro/Psych negative neurological ROS     GI/Hepatic GERD  ,  Endo/Other  negative endocrine ROS  Renal/GU negative Renal ROS     Musculoskeletal   Abdominal   Peds  Hematology Breast CA   Anesthesia Other Findings   Reproductive/Obstetrics                             Anesthesia Physical  Anesthesia Plan  ASA: II  Anesthesia Plan: MAC   Post-op Pain Management:    Induction: Intravenous  PONV Risk Score and Plan: 2 and TIVA, Midazolam and Treatment may vary due to age or medical condition  Airway Management Planned: Natural Airway  Additional Equipment:   Intra-op Plan:   Post-operative Plan:   Informed Consent: I have reviewed the patients History and Physical, chart, labs and discussed the procedure including the risks, benefits and alternatives for the proposed anesthesia with the patient or authorized representative who has indicated his/her understanding and acceptance.       Plan Discussed with: CRNA  Anesthesia Plan Comments:        Anesthesia Quick Evaluation

## 2019-04-20 NOTE — Discharge Instructions (Signed)

## 2019-04-21 ENCOUNTER — Encounter
Admission: RE | Disposition: A | Discharge status: Home / Self Care | Payer: Self-pay | Source: Home / Self Care | Attending: Ophthalmology

## 2019-04-21 ENCOUNTER — Other Ambulatory Visit: Payer: Self-pay

## 2019-04-21 ENCOUNTER — Ambulatory Visit
Admission: RE | Admit: 2019-04-21 | Discharge: 2019-04-21 | Disposition: A | Discharge status: Home / Self Care | Payer: Medicare Other | Attending: Ophthalmology | Admitting: Ophthalmology

## 2019-04-21 ENCOUNTER — Ambulatory Visit: Payer: Medicare Other | Admitting: Anesthesiology

## 2019-04-21 DIAGNOSIS — F329 Major depressive disorder, single episode, unspecified: Secondary | ICD-10-CM | POA: Diagnosis not present

## 2019-04-21 DIAGNOSIS — H25812 Combined forms of age-related cataract, left eye: Secondary | ICD-10-CM | POA: Diagnosis not present

## 2019-04-21 DIAGNOSIS — Z79899 Other long term (current) drug therapy: Secondary | ICD-10-CM | POA: Diagnosis not present

## 2019-04-21 DIAGNOSIS — Z888 Allergy status to other drugs, medicaments and biological substances status: Secondary | ICD-10-CM | POA: Diagnosis not present

## 2019-04-21 DIAGNOSIS — I10 Essential (primary) hypertension: Secondary | ICD-10-CM | POA: Insufficient documentation

## 2019-04-21 DIAGNOSIS — H2512 Age-related nuclear cataract, left eye: Secondary | ICD-10-CM | POA: Insufficient documentation

## 2019-04-21 DIAGNOSIS — M1990 Unspecified osteoarthritis, unspecified site: Secondary | ICD-10-CM | POA: Diagnosis not present

## 2019-04-21 DIAGNOSIS — K219 Gastro-esophageal reflux disease without esophagitis: Secondary | ICD-10-CM | POA: Diagnosis not present

## 2019-04-21 HISTORY — DX: Depression, unspecified: F32.A

## 2019-04-21 HISTORY — DX: Cardiac murmur, unspecified: R01.1

## 2019-04-21 HISTORY — PX: CATARACT EXTRACTION W/PHACO: SHX586

## 2019-04-21 HISTORY — DX: Unspecified osteoarthritis, unspecified site: M19.90

## 2019-04-21 SURGERY — PHACOEMULSIFICATION, CATARACT, WITH IOL INSERTION
Anesthesia: Monitor Anesthesia Care | Laterality: Left

## 2019-04-21 MED ORDER — EPINEPHRINE PF 1 MG/ML IJ SOLN
INTRAOCULAR | Status: DC | PRN
  Administered 2019-04-21: 48 mL via OPHTHALMIC

## 2019-04-21 MED ORDER — ARMC OPHTHALMIC DILATING DROPS
1.0000 "application " | OPHTHALMIC | Status: DC | PRN
  Administered 2019-04-21 (×3): 1 via OPHTHALMIC

## 2019-04-21 MED ORDER — NA CHONDROIT SULF-NA HYALURON 40-17 MG/ML IO SOLN
INTRAOCULAR | Status: DC | PRN
  Administered 2019-04-21: 1 mL via INTRAOCULAR

## 2019-04-21 MED ORDER — ACETAMINOPHEN 325 MG PO TABS: 650.0000 mg | ORAL_TABLET | Freq: Once | ORAL | Status: DC | PRN

## 2019-04-21 MED ORDER — MOXIFLOXACIN HCL 0.5 % OP SOLN
OPHTHALMIC | Status: DC | PRN
  Administered 2019-04-21: 0.2 mL via OPHTHALMIC

## 2019-04-21 MED ORDER — LIDOCAINE HCL (PF) 2 % IJ SOLN
INTRAOCULAR | Status: DC | PRN
  Administered 2019-04-21: 1 mL

## 2019-04-21 MED ORDER — ONDANSETRON HCL 4 MG/2ML IJ SOLN: 4.0000 mg | Freq: Once | INTRAMUSCULAR | Status: DC | PRN

## 2019-04-21 MED ORDER — MIDAZOLAM HCL 2 MG/2ML IJ SOLN
INTRAMUSCULAR | Status: DC | PRN
  Administered 2019-04-21: 1 mg via INTRAVENOUS

## 2019-04-21 MED ORDER — FENTANYL CITRATE (PF) 100 MCG/2ML IJ SOLN
INTRAMUSCULAR | Status: DC | PRN
  Administered 2019-04-21: 50 ug via INTRAVENOUS

## 2019-04-21 MED ORDER — BRIMONIDINE TARTRATE-TIMOLOL 0.2-0.5 % OP SOLN
OPHTHALMIC | Status: DC | PRN
  Administered 2019-04-21: 1 [drp] via OPHTHALMIC

## 2019-04-21 MED ORDER — TETRACAINE HCL 0.5 % OP SOLN
1.0000 [drp] | OPHTHALMIC | Status: DC | PRN
  Administered 2019-04-21 (×3): 1 [drp] via OPHTHALMIC

## 2019-04-21 MED ORDER — ACETAMINOPHEN 160 MG/5ML PO SOLN: 325.0000 mg | ORAL | Status: DC | PRN

## 2019-04-21 SURGICAL SUPPLY — 20 items
CANNULA ANT/CHMB 27G (MISCELLANEOUS) ×2 IMPLANT
CANNULA ANT/CHMB 27GA (MISCELLANEOUS) ×6 IMPLANT
GLOVE SURG LX 8.0 MICRO (GLOVE) ×2
GLOVE SURG LX STRL 8.0 MICRO (GLOVE) ×1 IMPLANT
GLOVE SURG TRIUMPH 8.0 PF LTX (GLOVE) ×3 IMPLANT
GOWN STRL REUS W/ TWL LRG LVL3 (GOWN DISPOSABLE) ×2 IMPLANT
GOWN STRL REUS W/TWL LRG LVL3 (GOWN DISPOSABLE) ×6
LENS IOL TECNIS ITEC 19.5 (Intraocular Lens) ×2 IMPLANT
MARKER SKIN DUAL TIP RULER LAB (MISCELLANEOUS) ×3 IMPLANT
NDL FILTER BLUNT 18X1 1/2 (NEEDLE) ×1 IMPLANT
NDL RETROBULBAR .5 NSTRL (NEEDLE) ×3 IMPLANT
NEEDLE FILTER BLUNT 18X 1/2SAF (NEEDLE) ×2
NEEDLE FILTER BLUNT 18X1 1/2 (NEEDLE) ×1 IMPLANT
PACK EYE AFTER SURG (MISCELLANEOUS) ×3 IMPLANT
PACK OPTHALMIC (MISCELLANEOUS) ×3 IMPLANT
PACK PORFILIO (MISCELLANEOUS) ×3 IMPLANT
SYR 3ML LL SCALE MARK (SYRINGE) ×3 IMPLANT
SYR TB 1ML LUER SLIP (SYRINGE) ×3 IMPLANT
WATER STERILE IRR 250ML POUR (IV SOLUTION) ×3 IMPLANT
WIPE NON LINTING 3.25X3.25 (MISCELLANEOUS) ×3 IMPLANT

## 2019-04-21 NOTE — Op Note (Signed)
PREOPERATIVE DIAGNOSIS:  Nuclear sclerotic cataract of the left eye.   POSTOPERATIVE DIAGNOSIS:  Nuclear sclerotic cataract of the left eye.   OPERATIVE PROCEDURE:@   SURGEON:  Birder Robson, MD.   ANESTHESIA:  Anesthesiologist: Darrin Nipper, MD CRNA: Georga Bora, CRNA; Cameron Ali, CRNA  1.      Managed anesthesia care. 2.     0.33ml of Shugarcaine was instilled following the paracentesis   COMPLICATIONS:  None.   TECHNIQUE:   Stop and chop   DESCRIPTION OF PROCEDURE:  The patient was examined and consented in the preoperative holding area where the aforementioned topical anesthesia was applied to the left eye and then brought back to the Operating Room where the left eye was prepped and draped in the usual sterile ophthalmic fashion and a lid speculum was placed. A paracentesis was created with the side port blade and the anterior chamber was filled with viscoelastic. A near clear corneal incision was performed with the steel keratome. A continuous curvilinear capsulorrhexis was performed with a cystotome followed by the capsulorrhexis forceps. Hydrodissection and hydrodelineation were carried out with BSS on a blunt cannula. The lens was removed in a stop and chop  technique and the remaining cortical material was removed with the irrigation-aspiration handpiece. The capsular bag was inflated with viscoelastic and the Technis ZCB00 lens was placed in the capsular bag without complication. The remaining viscoelastic was removed from the eye with the irrigation-aspiration handpiece. The wounds were hydrated. The anterior chamber was flushed with BSS and the eye was inflated to physiologic pressure. 0.38ml Vigamox was placed in the anterior chamber. The wounds were found to be water tight. The eye was dressed with Combigan. The patient was given protective glasses to wear throughout the day and a shield with which to sleep tonight. The patient was also given drops with which to begin a  drop regimen today and will follow-up with me in one day. Implant Name Type Inv. Item Serial No. Manufacturer Lot No. LRB No. Used Action  LENS IOL DIOP 19.5 - [id number] Intraocular Lens LENS IOL DIOP 19.5 [id number]  Left 1 Implanted    Procedure(s): CATARACT EXTRACTION PHACO AND INTRAOCULAR LENS PLACEMENT (IOC) LEFT (Left)  Electronically signed: Birder Robson 04/21/2019 11:53 AM

## 2019-04-21 NOTE — Anesthesia Procedure Notes (Signed)
Procedure Name: MAC Date/Time: 04/21/2019 11:35 AM Performed by: Cameron Ali, CRNA Pre-anesthesia Checklist: Patient identified, Emergency Drugs available, Suction available, Timeout performed and Patient being monitored Patient Re-evaluated:Patient Re-evaluated prior to induction Oxygen Delivery Method: Nasal cannula Placement Confirmation: positive ETCO2

## 2019-04-21 NOTE — Anesthesia Postprocedure Evaluation (Signed)
Anesthesia Post Note  Patient: Katelyn Knight  Procedure(s) Performed: CATARACT EXTRACTION PHACO AND INTRAOCULAR LENS PLACEMENT (IOC) LEFT (Left )     Patient location during evaluation: PACU Anesthesia Type: MAC Level of consciousness: awake and alert, oriented and patient cooperative Pain management: pain level controlled Vital Signs Assessment: post-procedure vital signs reviewed and stable Respiratory status: spontaneous breathing, nonlabored ventilation and respiratory function stable Cardiovascular status: blood pressure returned to baseline and stable Postop Assessment: adequate PO intake Anesthetic complications: no    Darrin Nipper

## 2019-04-21 NOTE — H&P (Signed)
All labs reviewed. Abnormal studies sent to patients PCP when indicated.  Previous H&P reviewed, patient examined, there are NO CHANGES.  Katelyn Meckler Porfilio12/8/202011:30 AM

## 2019-04-21 NOTE — Transfer of Care (Signed)
Immediate Anesthesia Transfer of Care Note  Patient: Katelyn Knight  Procedure(s) Performed: CATARACT EXTRACTION PHACO AND INTRAOCULAR LENS PLACEMENT (IOC) LEFT (Left )  Patient Location: PACU  Anesthesia Type: MAC  Level of Consciousness: awake, alert  and patient cooperative  Airway and Oxygen Therapy: Patient Spontanous Breathing and Patient connected to supplemental oxygen  Post-op Assessment: Post-op Vital signs reviewed, Patient's Cardiovascular Status Stable, Respiratory Function Stable, Patent Airway and No signs of Nausea or vomiting  Post-op Vital Signs: Reviewed and stable  Complications: No apparent anesthesia complications

## 2019-04-22 ENCOUNTER — Encounter: Payer: Self-pay | Admitting: Ophthalmology

## 2019-05-27 ENCOUNTER — Telehealth: Payer: Self-pay | Admitting: Family Medicine

## 2019-05-27 NOTE — Telephone Encounter (Signed)
We could try decreasing to 30 mg to see if that would be cheaper and still be effective.

## 2019-05-27 NOTE — Telephone Encounter (Signed)
Pt had a question about dexlansoprazole (DEXILANT) 60 MG capsule

## 2019-05-27 NOTE — Telephone Encounter (Signed)
Patient had a question she is taking 60 mg of this medication and she was wondering if she could get a generic and take 2 to total 60 mg instead to make the medication cheaper. Please advise.  Manolo Bosket,cma

## 2019-05-28 NOTE — Telephone Encounter (Signed)
dexilant is one of the highest potency medications in this class. We can switch to something that might be cheaper though may not work as well. The other options are protonix, omeprazole, and esomeprazole. Please see if she would be ok with either of these.

## 2019-05-28 NOTE — Telephone Encounter (Signed)
Patient states that when she started this medication Dexilant she was still paying the amount of $290 so that will not work, she is willing to do generic or something higher in dosage, please advise.  Zalan Shidler,cma

## 2019-05-29 ENCOUNTER — Other Ambulatory Visit: Payer: Self-pay

## 2019-05-29 NOTE — Telephone Encounter (Signed)
She should not take that dose of prilosec. 40 mg of prilosec is the maximum recommended dose. If she wanted to try prilosec 40 mg daily that would be fine.

## 2019-05-29 NOTE — Telephone Encounter (Signed)
Patient stated no she would just wait.  She did not want to try those.  She wants to take 3 of the Prilosec and see if that helps.  Rashida Ladouceur,cma

## 2019-05-29 NOTE — Telephone Encounter (Signed)
I called and explained to the patient that for the Prilosec her maximum dose should be 40 mg and patient understood.  Katelyn Knight,cma

## 2019-06-04 DIAGNOSIS — Z961 Presence of intraocular lens: Secondary | ICD-10-CM | POA: Diagnosis not present

## 2019-06-22 ENCOUNTER — Other Ambulatory Visit: Payer: Self-pay | Admitting: Family Medicine

## 2019-07-16 ENCOUNTER — Other Ambulatory Visit: Payer: Self-pay | Admitting: Family Medicine

## 2019-07-16 ENCOUNTER — Ambulatory Visit
Admission: RE | Admit: 2019-07-16 | Discharge: 2019-07-16 | Disposition: A | Discharge status: Home / Self Care | Payer: Medicare Other | Source: Ambulatory Visit | Attending: Family Medicine | Admitting: Family Medicine

## 2019-07-16 DIAGNOSIS — M25551 Pain in right hip: Secondary | ICD-10-CM | POA: Insufficient documentation

## 2019-07-16 DIAGNOSIS — K219 Gastro-esophageal reflux disease without esophagitis: Secondary | ICD-10-CM | POA: Diagnosis not present

## 2019-07-16 DIAGNOSIS — M25552 Pain in left hip: Secondary | ICD-10-CM

## 2019-07-16 DIAGNOSIS — M1612 Unilateral primary osteoarthritis, left hip: Secondary | ICD-10-CM | POA: Diagnosis not present

## 2019-07-16 DIAGNOSIS — I10 Essential (primary) hypertension: Secondary | ICD-10-CM | POA: Diagnosis not present

## 2019-07-16 DIAGNOSIS — M79604 Pain in right leg: Secondary | ICD-10-CM | POA: Diagnosis not present

## 2019-08-14 ENCOUNTER — Other Ambulatory Visit: Payer: Self-pay | Admitting: Family Medicine

## 2019-09-03 ENCOUNTER — Emergency Department: Payer: Medicare Other

## 2019-09-03 ENCOUNTER — Observation Stay: Payer: Medicare Other | Admitting: Anesthesiology

## 2019-09-03 ENCOUNTER — Other Ambulatory Visit: Payer: Self-pay

## 2019-09-03 ENCOUNTER — Encounter: Payer: Self-pay | Admitting: Emergency Medicine

## 2019-09-03 ENCOUNTER — Observation Stay
Admission: EM | Admit: 2019-09-03 | Discharge: 2019-09-04 | Disposition: A | Discharge status: Home / Self Care | Payer: Medicare Other | Attending: Surgery | Admitting: Surgery

## 2019-09-03 ENCOUNTER — Encounter
Admission: EM | Disposition: A | Discharge status: Home / Self Care | Payer: Self-pay | Source: Home / Self Care | Attending: Emergency Medicine

## 2019-09-03 DIAGNOSIS — K82A1 Gangrene of gallbladder in cholecystitis: Secondary | ICD-10-CM | POA: Diagnosis not present

## 2019-09-03 DIAGNOSIS — Z79899 Other long term (current) drug therapy: Secondary | ICD-10-CM | POA: Diagnosis not present

## 2019-09-03 DIAGNOSIS — R079 Chest pain, unspecified: Secondary | ICD-10-CM | POA: Diagnosis not present

## 2019-09-03 DIAGNOSIS — K8020 Calculus of gallbladder without cholecystitis without obstruction: Secondary | ICD-10-CM | POA: Diagnosis present

## 2019-09-03 DIAGNOSIS — K219 Gastro-esophageal reflux disease without esophagitis: Secondary | ICD-10-CM | POA: Diagnosis not present

## 2019-09-03 DIAGNOSIS — I129 Hypertensive chronic kidney disease with stage 1 through stage 4 chronic kidney disease, or unspecified chronic kidney disease: Secondary | ICD-10-CM | POA: Insufficient documentation

## 2019-09-03 DIAGNOSIS — Z923 Personal history of irradiation: Secondary | ICD-10-CM | POA: Diagnosis not present

## 2019-09-03 DIAGNOSIS — K8012 Calculus of gallbladder with acute and chronic cholecystitis without obstruction: Secondary | ICD-10-CM | POA: Diagnosis not present

## 2019-09-03 DIAGNOSIS — R001 Bradycardia, unspecified: Secondary | ICD-10-CM | POA: Diagnosis not present

## 2019-09-03 DIAGNOSIS — Z20822 Contact with and (suspected) exposure to covid-19: Secondary | ICD-10-CM | POA: Insufficient documentation

## 2019-09-03 DIAGNOSIS — F329 Major depressive disorder, single episode, unspecified: Secondary | ICD-10-CM | POA: Insufficient documentation

## 2019-09-03 DIAGNOSIS — Z853 Personal history of malignant neoplasm of breast: Secondary | ICD-10-CM | POA: Insufficient documentation

## 2019-09-03 DIAGNOSIS — M1990 Unspecified osteoarthritis, unspecified site: Secondary | ICD-10-CM | POA: Insufficient documentation

## 2019-09-03 DIAGNOSIS — E785 Hyperlipidemia, unspecified: Secondary | ICD-10-CM | POA: Diagnosis not present

## 2019-09-03 DIAGNOSIS — K828 Other specified diseases of gallbladder: Secondary | ICD-10-CM | POA: Diagnosis not present

## 2019-09-03 DIAGNOSIS — N183 Chronic kidney disease, stage 3 unspecified: Secondary | ICD-10-CM | POA: Diagnosis not present

## 2019-09-03 DIAGNOSIS — F419 Anxiety disorder, unspecified: Secondary | ICD-10-CM | POA: Insufficient documentation

## 2019-09-03 DIAGNOSIS — R1011 Right upper quadrant pain: Secondary | ICD-10-CM

## 2019-09-03 LAB — COMPREHENSIVE METABOLIC PANEL
ALT: 26 U/L (ref 0–44)
AST: 36 U/L (ref 15–41)
Albumin: 4.7 g/dL (ref 3.5–5.0)
Alkaline Phosphatase: 101 U/L (ref 38–126)
Anion gap: 10 (ref 5–15)
BUN: 19 mg/dL (ref 8–23)
CO2: 26 mmol/L (ref 22–32)
Calcium: 10.1 mg/dL (ref 8.9–10.3)
Chloride: 100 mmol/L (ref 98–111)
Creatinine, Ser: 0.93 mg/dL (ref 0.44–1.00)
GFR calc Af Amer: >60 mL/min (ref >60)
GFR calc non Af Amer: 60 mL/min — ABNORMAL LOW (ref >60)
Glucose, Bld: 151 mg/dL — ABNORMAL HIGH (ref 70–99)
Potassium: 3.8 mmol/L (ref 3.5–5.1)
Sodium: 136 mmol/L (ref 135–145)
Total Bilirubin: 0.6 mg/dL (ref 0.3–1.2)
Total Protein: 7.5 g/dL (ref 6.5–8.1)

## 2019-09-03 LAB — URINALYSIS, COMPLETE (UACMP) WITH MICROSCOPIC
Bacteria, UA: NONE SEEN
Bilirubin Urine: NEGATIVE
Glucose, UA: NEGATIVE mg/dL
Hgb urine dipstick: NEGATIVE
Ketones, ur: NEGATIVE mg/dL
Nitrite: NEGATIVE
Protein, ur: NEGATIVE mg/dL
Specific Gravity, Urine: 1.02 (ref 1.005–1.030)
pH: 5 (ref 5.0–8.0)

## 2019-09-03 LAB — CBC
HCT: 40.7 % (ref 36.0–46.0)
Hemoglobin: 13.8 g/dL (ref 12.0–15.0)
MCH: 31.6 pg (ref 26.0–34.0)
MCHC: 33.9 g/dL (ref 30.0–36.0)
MCV: 93.1 fL (ref 80.0–100.0)
Platelets: 173 10*3/uL (ref 150–400)
RBC: 4.37 MIL/uL (ref 3.87–5.11)
RDW: 12.8 % (ref 11.5–15.5)
WBC: 7.7 10*3/uL (ref 4.0–10.5)
nRBC: 0 % (ref 0.0–0.2)

## 2019-09-03 LAB — TROPONIN I (HIGH SENSITIVITY)
Troponin I (High Sensitivity): 8 ng/L (ref <18)
Troponin I (High Sensitivity): 13 ng/L (ref <18)

## 2019-09-03 LAB — LIPASE, BLOOD: Lipase: 43 U/L (ref 11–51)

## 2019-09-03 LAB — RESPIRATORY PANEL BY RT PCR (FLU A&B, COVID)
Influenza A by PCR: NEGATIVE
Influenza B by PCR: NEGATIVE
SARS Coronavirus 2 by RT PCR: NEGATIVE

## 2019-09-03 SURGERY — CHOLECYSTECTOMY, ROBOT-ASSISTED, LAPAROSCOPIC
Anesthesia: General | Site: Abdomen

## 2019-09-03 MED ORDER — EPHEDRINE 5 MG/ML INJ
INTRAVENOUS | Status: AC
  Filled 2019-09-03: qty 10

## 2019-09-03 MED ORDER — SODIUM CHLORIDE 0.9 % IV SOLN: INTRAVENOUS | Status: DC

## 2019-09-03 MED ORDER — SUCCINYLCHOLINE CHLORIDE 20 MG/ML IJ SOLN
INTRAMUSCULAR | Status: DC | PRN
  Administered 2019-09-03: 80 mg via INTRAVENOUS

## 2019-09-03 MED ORDER — OXYCODONE HCL 5 MG PO TABS: 5.0000 mg | ORAL_TABLET | Freq: Once | ORAL | Status: DC | PRN

## 2019-09-03 MED ORDER — ONDANSETRON HCL 4 MG/2ML IJ SOLN
4.0000 mg | Freq: Once | INTRAMUSCULAR | Status: AC
  Administered 2019-09-03: 4 mg via INTRAVENOUS
  Filled 2019-09-03: qty 2

## 2019-09-03 MED ORDER — PHENYLEPHRINE HCL (PRESSORS) 10 MG/ML IV SOLN
INTRAVENOUS | Status: DC | PRN
  Administered 2019-09-03 (×3): 100 ug via INTRAVENOUS
  Administered 2019-09-03: 200 ug via INTRAVENOUS
  Administered 2019-09-03 (×5): 100 ug via INTRAVENOUS

## 2019-09-03 MED ORDER — ROCURONIUM BROMIDE 100 MG/10ML IV SOLN
INTRAVENOUS | Status: DC | PRN
  Administered 2019-09-03: 20 mg via INTRAVENOUS
  Administered 2019-09-03: 30 mg via INTRAVENOUS

## 2019-09-03 MED ORDER — PROPOFOL 10 MG/ML IV BOLUS
INTRAVENOUS | Status: DC | PRN
  Administered 2019-09-03: 50 mg via INTRAVENOUS
  Administered 2019-09-03: 100 mg via INTRAVENOUS

## 2019-09-03 MED ORDER — ONDANSETRON HCL 4 MG/2ML IJ SOLN
INTRAMUSCULAR | Status: AC
  Filled 2019-09-03: qty 2

## 2019-09-03 MED ORDER — BUPIVACAINE LIPOSOME 1.3 % IJ SUSP
INTRAMUSCULAR | Status: AC
  Filled 2019-09-03: qty 20

## 2019-09-03 MED ORDER — EPHEDRINE SULFATE 50 MG/ML IJ SOLN
INTRAMUSCULAR | Status: DC | PRN
  Administered 2019-09-03: 10 mg via INTRAVENOUS

## 2019-09-03 MED ORDER — LACTATED RINGERS IV SOLN: INTRAVENOUS | Status: DC | PRN

## 2019-09-03 MED ORDER — SUCCINYLCHOLINE CHLORIDE 200 MG/10ML IV SOSY
PREFILLED_SYRINGE | INTRAVENOUS | Status: AC
  Filled 2019-09-03: qty 10

## 2019-09-03 MED ORDER — LIDOCAINE HCL (PF) 2 % IJ SOLN
INTRAMUSCULAR | Status: AC
  Filled 2019-09-03: qty 5

## 2019-09-03 MED ORDER — LIDOCAINE HCL (CARDIAC) PF 100 MG/5ML IV SOSY
PREFILLED_SYRINGE | INTRAVENOUS | Status: DC | PRN
  Administered 2019-09-03: 60 mg via INTRAVENOUS
  Administered 2019-09-03: 40 mg via INTRAVENOUS
  Administered 2019-09-03: 60 mg via INTRAVENOUS

## 2019-09-03 MED ORDER — MORPHINE SULFATE (PF) 4 MG/ML IV SOLN
4.0000 mg | Freq: Once | INTRAVENOUS | Status: AC
  Administered 2019-09-03: 4 mg via INTRAVENOUS
  Filled 2019-09-03: qty 1

## 2019-09-03 MED ORDER — SUGAMMADEX SODIUM 200 MG/2ML IV SOLN
INTRAVENOUS | Status: DC | PRN
  Administered 2019-09-03: 150 mg via INTRAVENOUS

## 2019-09-03 MED ORDER — OXYCODONE HCL 5 MG/5ML PO SOLN: 5.0000 mg | Freq: Once | ORAL | Status: DC | PRN

## 2019-09-03 MED ORDER — ONDANSETRON 4 MG PO TBDP: 4.0000 mg | ORAL_TABLET | Freq: Four times a day (QID) | ORAL | Status: DC | PRN

## 2019-09-03 MED ORDER — BUPIVACAINE LIPOSOME 1.3 % IJ SUSP
INTRAMUSCULAR | Status: DC | PRN
  Administered 2019-09-03: 20 mL

## 2019-09-03 MED ORDER — DEXAMETHASONE SODIUM PHOSPHATE 10 MG/ML IJ SOLN
INTRAMUSCULAR | Status: AC
  Filled 2019-09-03: qty 1

## 2019-09-03 MED ORDER — ONDANSETRON HCL 4 MG/2ML IJ SOLN: 4.0000 mg | Freq: Once | INTRAMUSCULAR | Status: DC | PRN

## 2019-09-03 MED ORDER — INDOCYANINE GREEN 25 MG IV SOLR
5.0000 mg | Freq: Once | INTRAVENOUS | Status: AC
  Administered 2019-09-03: 5 mg via INTRAVENOUS

## 2019-09-03 MED ORDER — PIPERACILLIN-TAZOBACTAM 3.375 G IVPB
3.3750 g | Freq: Three times a day (TID) | INTRAVENOUS | Status: DC
  Administered 2019-09-03 – 2019-09-04 (×2): 3.375 g via INTRAVENOUS
  Filled 2019-09-03 (×3): qty 50

## 2019-09-03 MED ORDER — ONDANSETRON HCL 4 MG/2ML IJ SOLN: 4.0000 mg | Freq: Four times a day (QID) | INTRAMUSCULAR | Status: DC | PRN

## 2019-09-03 MED ORDER — SODIUM CHLORIDE 0.9 % IV SOLN
INTRAVENOUS | Status: DC | PRN
  Administered 2019-09-03: 25 ug/min via INTRAVENOUS

## 2019-09-03 MED ORDER — FENTANYL CITRATE (PF) 100 MCG/2ML IJ SOLN
INTRAMUSCULAR | Status: DC | PRN
  Administered 2019-09-03 (×3): 50 ug via INTRAVENOUS

## 2019-09-03 MED ORDER — ONDANSETRON HCL 4 MG/2ML IJ SOLN
INTRAMUSCULAR | Status: DC | PRN
  Administered 2019-09-03: 4 mg via INTRAVENOUS

## 2019-09-03 MED ORDER — FENTANYL CITRATE (PF) 100 MCG/2ML IJ SOLN
INTRAMUSCULAR | Status: AC
  Filled 2019-09-03: qty 2

## 2019-09-03 MED ORDER — FENTANYL CITRATE (PF) 100 MCG/2ML IJ SOLN: 25.0000 ug | INTRAMUSCULAR | Status: DC | PRN

## 2019-09-03 MED ORDER — BUPIVACAINE HCL (PF) 0.5 % IJ SOLN
INTRAMUSCULAR | Status: AC
  Filled 2019-09-03: qty 30

## 2019-09-03 MED ORDER — PROPOFOL 10 MG/ML IV BOLUS
INTRAVENOUS | Status: AC
  Filled 2019-09-03: qty 20

## 2019-09-03 MED ORDER — BUPIVACAINE HCL (PF) 0.25 % IJ SOLN
INTRAMUSCULAR | Status: AC
  Filled 2019-09-03: qty 30

## 2019-09-03 MED ORDER — BUPIVACAINE-EPINEPHRINE (PF) 0.25% -1:200000 IJ SOLN
INTRAMUSCULAR | Status: DC | PRN
  Administered 2019-09-03: 30 mL

## 2019-09-03 MED ORDER — ESCITALOPRAM OXALATE 10 MG PO TABS
10.0000 mg | ORAL_TABLET | Freq: Every day | ORAL | Status: DC
  Administered 2019-09-04: 10 mg via ORAL
  Filled 2019-09-03: qty 1

## 2019-09-03 MED ORDER — ROCURONIUM BROMIDE 10 MG/ML (PF) SYRINGE
PREFILLED_SYRINGE | INTRAVENOUS | Status: AC
  Filled 2019-09-03: qty 10

## 2019-09-03 MED ORDER — MORPHINE SULFATE (PF) 2 MG/ML IV SOLN: 2.0000 mg | INTRAVENOUS | Status: DC | PRN

## 2019-09-03 MED ORDER — ACETAMINOPHEN 500 MG PO TABS
1000.0000 mg | ORAL_TABLET | Freq: Four times a day (QID) | ORAL | Status: DC
  Administered 2019-09-03 – 2019-09-04 (×3): 1000 mg via ORAL
  Filled 2019-09-03 (×3): qty 2

## 2019-09-03 MED ORDER — SODIUM CHLORIDE 0.9 % IV SOLN: Freq: Once | INTRAVENOUS | Status: AC

## 2019-09-03 MED ORDER — ONDANSETRON 4 MG PO TBDP
4.0000 mg | ORAL_TABLET | Freq: Once | ORAL | Status: AC | PRN
  Administered 2019-09-03: 4 mg via ORAL
  Filled 2019-09-03: qty 1

## 2019-09-03 MED ORDER — DEXAMETHASONE SODIUM PHOSPHATE 10 MG/ML IJ SOLN
INTRAMUSCULAR | Status: DC | PRN
  Administered 2019-09-03: 10 mg via INTRAVENOUS

## 2019-09-03 SURGICAL SUPPLY — 60 items
ADH SKN CLS APL DERMABOND .7 (GAUZE/BANDAGES/DRESSINGS) ×1
APL PRP STRL LF DISP 70% ISPRP (MISCELLANEOUS) ×1
BAG SPEC RTRVL LRG 6X4 10 (ENDOMECHANICALS) ×1
CANISTER SUCT 1200ML W/VALVE (MISCELLANEOUS) ×5 IMPLANT
CHLORAPREP W/TINT 26 (MISCELLANEOUS) ×3 IMPLANT
CLIP VESOLOCK MED LG 6/CT (CLIP) ×3 IMPLANT
COVER WAND RF STERILE (DRAPES) ×3 IMPLANT
DECANTER SPIKE VIAL GLASS SM (MISCELLANEOUS) ×3 IMPLANT
DEFOGGER SCOPE WARMER CLEARIFY (MISCELLANEOUS) ×3 IMPLANT
DERMABOND ADVANCED (GAUZE/BANDAGES/DRESSINGS) ×2
DERMABOND ADVANCED .7 DNX12 (GAUZE/BANDAGES/DRESSINGS) ×1 IMPLANT
DRAPE ARM DVNC X/XI (DISPOSABLE) ×4 IMPLANT
DRAPE COLUMN DVNC XI (DISPOSABLE) ×1 IMPLANT
DRAPE DA VINCI XI ARM (DISPOSABLE) ×12
DRAPE DA VINCI XI COLUMN (DISPOSABLE) ×3
DRAPE INCISE IOBAN 66X45 STRL (DRAPES) ×2 IMPLANT
ELECT CAUTERY BLADE 6.4 (BLADE) ×3 IMPLANT
ELECT REM PT RETURN 9FT ADLT (ELECTROSURGICAL) ×3
ELECTRODE REM PT RTRN 9FT ADLT (ELECTROSURGICAL) ×1 IMPLANT
ENDOLOOP SUT PDS II  0 18 (SUTURE) ×3
ENDOLOOP SUT PDS II 0 18 (SUTURE) IMPLANT
GLOVE BIO SURGEON STRL SZ7 (GLOVE) ×8 IMPLANT
GLOVE INDICATOR 6.5 STRL GRN (GLOVE) ×2 IMPLANT
GLOVE INDICATOR 7.5 STRL GRN (GLOVE) ×4 IMPLANT
GOWN STRL REUS W/ TWL LRG LVL3 (GOWN DISPOSABLE) ×4 IMPLANT
GOWN STRL REUS W/TWL LRG LVL3 (GOWN DISPOSABLE) ×21
HEMOSTAT SURGICEL 2X3 (HEMOSTASIS) ×2 IMPLANT
IRRIGATION STRYKERFLOW (MISCELLANEOUS) IMPLANT
IRRIGATOR STRYKERFLOW (MISCELLANEOUS) ×3
IV NS 1000ML (IV SOLUTION) ×6
IV NS 1000ML BAXH (IV SOLUTION) IMPLANT
KIT PINK PAD W/HEAD ARE REST (MISCELLANEOUS) ×3
KIT PINK PAD W/HEAD ARM REST (MISCELLANEOUS) ×1 IMPLANT
L-HOOK LAP DISP 36CM (ELECTROSURGICAL) ×3
LABEL OR SOLS (LABEL) ×3 IMPLANT
LHOOK LAP DISP 36CM (ELECTROSURGICAL) IMPLANT
NEEDLE HYPO 22GX1.5 SAFETY (NEEDLE) ×3 IMPLANT
NS IRRIG 500ML POUR BTL (IV SOLUTION) ×3 IMPLANT
OBTURATOR OPTICAL STANDARD 8MM (TROCAR) ×3
OBTURATOR OPTICAL STND 8 DVNC (TROCAR) ×1
OBTURATOR OPTICALSTD 8 DVNC (TROCAR) ×1 IMPLANT
PACK LAP CHOLECYSTECTOMY (MISCELLANEOUS) ×3 IMPLANT
PENCIL ELECTRO HAND CTR (MISCELLANEOUS) ×3 IMPLANT
POUCH SPECIMEN RETRIEVAL 10MM (ENDOMECHANICALS) ×3 IMPLANT
SCISSORS METZENBAUM CVD 33 (INSTRUMENTS) ×2 IMPLANT
SEAL CANN UNIV 5-8 DVNC XI (MISCELLANEOUS) ×4 IMPLANT
SEAL XI 5MM-8MM UNIVERSAL (MISCELLANEOUS) ×12
SET TUBE SMOKE EVAC HIGH FLOW (TUBING) ×3 IMPLANT
SOLUTION ELECTROLUBE (MISCELLANEOUS) ×3 IMPLANT
SPONGE LAP 18X18 RF (DISPOSABLE) ×7 IMPLANT
SPONGE LAP 4X18 RFD (DISPOSABLE) ×2 IMPLANT
SPONGE VERSALON 4X4 4PLY (MISCELLANEOUS) ×2 IMPLANT
STAPLER SKIN PROX 35W (STAPLE) ×2 IMPLANT
SUT MNCRL AB 4-0 PS2 18 (SUTURE) ×3 IMPLANT
SUT PDS AB 0 CT1 27 (SUTURE) ×4 IMPLANT
SUT VICRYL 0 AB UR-6 (SUTURE) ×6 IMPLANT
SYS LAPSCP GELPORT 120MM (MISCELLANEOUS) ×3
SYSTEM LAPSCP GELPORT 120MM (MISCELLANEOUS) IMPLANT
TROCAR 130MM GELPORT  DAV (MISCELLANEOUS) ×3 IMPLANT
TROCAR XCEL NON-BLD 5MMX100MML (ENDOMECHANICALS) ×2 IMPLANT

## 2019-09-03 NOTE — Anesthesia Procedure Notes (Signed)
Procedure Name: Intubation Date/Time: 09/03/2019 6:17 PM Performed by: Aline Brochure, CRNA Pre-anesthesia Checklist: Patient identified, Emergency Drugs available, Suction available and Patient being monitored Patient Re-evaluated:Patient Re-evaluated prior to induction Oxygen Delivery Method: Circle system utilized Preoxygenation: Pre-oxygenation with 100% oxygen Induction Type: IV induction Ventilation: Mask ventilation without difficulty Laryngoscope Size: McGraph and 3 Grade View: Grade I Tube type: Oral Tube size: 7.0 mm Number of attempts: 1 Airway Equipment and Method: Stylet and Video-laryngoscopy Placement Confirmation: ETT inserted through vocal cords under direct vision,  positive ETCO2 and breath sounds checked- equal and bilateral Secured at: 21 cm Tube secured with: Tape Dental Injury: Teeth and Oropharynx as per pre-operative assessment

## 2019-09-03 NOTE — ED Notes (Signed)
Pt trx to ct

## 2019-09-03 NOTE — ED Notes (Signed)
Surgical PA at bedside  

## 2019-09-03 NOTE — ED Triage Notes (Signed)
Patient to ER for c/o upper right sided abd pain with N/V. Denies h/o gallbladder issues.

## 2019-09-03 NOTE — Op Note (Addendum)
Procedure:   Attempted Robotic assisted laparoscopic Cholecystectomy Hand assisted laparoscopic cholecystectomy  Pre-operative Diagnosis: Acute Cholecystitis   Post-operative Diagnosis: Same  Surgeon: Caroleen Hamman, MD FACS  Anesthesia: Gen. with endotracheal tube  Findings: Gangrenous Cholecystitis  Very raw liver due to inflammatory response No evidence of bile leaks and cystic duct and CBD clearly visualized Very challenging case due to gangrenous cholecystitis and severe inflammatory response  Estimated Blood Loss: 200 cc       Specimens: Gallbladder           Complications: none  Procedure Details  The patient was seen again in the Holding Room. The benefits, complications, treatment options, and expected outcomes were discussed with the patient. The risks of bleeding, infection, recurrence of symptoms, failure to resolve symptoms, bile duct damage, bile duct leak, retained common bile duct stone, bowel injury, any of which could require further surgery and/or ERCP, stent, or papillotomy were reviewed with the patient. The likelihood of improving the patient's symptoms with return to their baseline status is good.  The patient and/or family concurred with the proposed plan, giving informed consent.  The patient was taken to Operating Room, identified  and the procedure verified as Laparoscopic Cholecystectomy.  A Time Out was held and the above information confirmed.  Prior to the induction of general anesthesia, antibiotic prophylaxis was administered. VTE prophylaxis was in place. General endotracheal anesthesia was then administered and tolerated well. After the induction, the abdomen was prepped with Chloraprep and draped in the sterile fashion. The patient was positioned in the supine position.  Cut down technique was used to enter the abdominal cavity and a Hasson trochar was placed after two vicryl stitches were anchored to the fascia. Pneumoperitoneum was then created with  CO2 and tolerated well without any adverse changes in the patient's vital signs.  Three 8-mm ports were placed under direct vision. All skin incisions  were infiltrated with a local anesthetic agent before making the incision and placing the trocars.   The patient was positioned  in reverse Trendelenburg, robot was brought to the surgical field and docked in the standard fashion.  We made sure all the instrumentation was kept indirect view at all times and that there were no collision between the arms. I scrubbed out and went to the console.  Initial view revealed significant adhesions I spent at least 45-minute minutes trying to lyse adhesions robotically until I got a good window.  The gallbladder was severely inflamed distended and gangrenous.  I was able to retracted but there was significant difficulty with visualization due to a very limited dissecting window.  There was significant adhesions of the liver to the right abdominal wall that made things very very challenging.  After attempting a robotic approach I realized that I was not making progress and decided to convert to I had assisted mode.   Robotic instruments and robotic arms were undocked in the standard fashion.  I scrubbed back in. I enlarged my incision towards the xiphoid process made a 8 cm incision and extended my fascial incision.  I placed the GelPort device in the standard fashion.  Using the other robotic ports I was able to triangulate and able to establish a good window of dissection.  I had to finger fracture some of the adhesions that were impairing adequate mobilization and adequate exposure.  Once were able to have a good exposure there was significant inflammatory response rather from the wound and had to use the dome down technique.  The gallbladder was removed from the liver surface using electrocautery.  Please note that there was significant oozing from the liver bed due to severe inflammatory response I was able to  cauterize it.  We had to decompress the gallbladder due to hydrops.  I was then able to clearly identify the junction between the cystic duct and the common bile duct.  I also visualized the cystic artery and the artery. Given the significant for maternal response I decided to place an Endoloop in each structure under direct visualization.  The gallbladder was removed in the standard fashion.   Hemostasis was achieved with the electrocautery. Inspection of the right upper quadrant was performed. No bleeding, bile duct injury or leak, or bowel injury was noted. A 19 Blake drain was placed in the right upper quadrant and was secured to the skin. Pneumoperitoneum was released.    All the laparoscopic ports were removed.  I use liposomal Marcaine to perform an abdominal wall block.  Fascia was closed with a running 0 PDS suture.  Skin incisions were closed with staples.  The patient was then extubated and brought to the recovery room in stable condition. Sponge, lap, and needle counts were correct at closure and at the conclusion of the case.               Caroleen Hamman, MD, FACS

## 2019-09-03 NOTE — Anesthesia Preprocedure Evaluation (Signed)
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)  Airway Mallampati: I  TM Distance: >3 FB Neck ROM: full    Dental  (+) Caps   Pulmonary neg pulmonary ROS, neg COPD,    breath sounds clear to auscultation       Cardiovascular hypertension, (-) angina(-) dysrhythmias + Valvular Problems/Murmurs MVP  Rhythm:regular Rate:Normal + Systolic murmurs    Neuro/Psych PSYCHIATRIC DISORDERS Anxiety Depression negative neurological ROS     GI/Hepatic Neg liver ROS, GERD  Controlled,  Endo/Other  negative endocrine ROS  Renal/GU Renal disease     Musculoskeletal   Abdominal   Peds  Hematology negative hematology ROS (+)   Anesthesia Other Findings Past Medical History: No date: Arthritis     Comment:  all over, takes osteobiflex 2007: Breast cancer ([place])     Comment:  Right breast No date: Depression No date: GERD (gastroesophageal reflux disease) No date: Heart murmur     Comment:  diagnosed in 40"s, no issues, mitral valve prolapse No date: HTN (hypertension)     Comment:  controlled on meds No date: Hyperlipidemia No date: Menopausal symptom No date: Osteoporosis 2007: Personal history of radiation therapy     Comment:  Right - mammocyte 2014: Shingles  Past Surgical History: No date: ABDOMINAL HYSTERECTOMY No date: ABDOMINAL HYSTERECTOMY No date: APPENDECTOMY     Comment:  At 74 years old 2007: BREAST BIOPSY; Right     Comment:  Positive 01/2006: BREAST LUMPECTOMY     Comment:  Dx Breast Cancer No date: BUNIONECTOMY 03/31/2019: CATARACT EXTRACTION W/PHACO; Right     Comment:  Procedure: CATARACT EXTRACTION PHACO AND  INTRAOCULAR               LENS PLACEMENT ([facility]) RIGHT;  Surgeon: Birder Robson,               MD;  Location: [facility];  Service:               Ophthalmology;  Laterality: Right;  CDE 3.84 [place] 0:30.7 04/21/2019: CATARACT EXTRACTION W/PHACO; Left     Comment:  Procedure: CATARACT EXTRACTION PHACO AND INTRAOCULAR               LENS PLACEMENT ([facility]) LEFT;  Surgeon: Birder Robson,               MD;  Location: [facility];  Service:               Ophthalmology;  Laterality: Left;  3.62   00:28.9   48  BMI    Body Mass Index: 25.13 kg/m      Reproductive/Obstetrics negative OB ROS                             Anesthesia Physical Anesthesia Plan  ASA: II  Anesthesia Plan: General ETT   Post-op Pain Management:    Induction:   PONV Risk Score and Plan: Ondansetron, Dexamethasone and Treatment may vary due to age or medical condition  Airway Management Planned:   Additional Equipment:   Intra-op Plan:   Post-operative Plan:   Informed Consent: I have reviewed the patients History and Physical, chart, labs  and discussed the procedure including the risks, benefits and alternatives for the proposed anesthesia with the patient or authorized representative who has indicated his/her understanding and acceptance.     Dental Advisory Given  Plan Discussed with: Anesthesiologist  Anesthesia Plan Comments:         Anesthesia Quick Evaluation

## 2019-09-03 NOTE — ED Notes (Signed)
ED Provider at bedside. 

## 2019-09-03 NOTE — H&P (Signed)
[facility] SURGICAL HISTORY & PHYSICAL (cpt [phone])  HISTORY OF PRESENT ILLNESS (HPI):  74 y.o. female presented to [facility] ED today for abdominal pain. Patient reports that around 3AM last night she woke up with a sharp burning RUQ abdominal pain. This radiated under her ribs and towards her should. She notes that the pain has been constant and essentially unchanged since the onset. She denied any history of similar or less severe pain in the past. She endorsed associated nausea, emesis, and diarrhea since the onset of pain. No fever, chills, cough, congestion, CP, SOB, jaundice, urinary changes, or acholic stools. She has had a hysterectomy and appendectomy in the past. Work up in the ED was concerning for a large immobile gallstone on [place]. Pain control was attempted in the ED but unsuccessful.   General surgery is consulted by emergency medicine physician Dr Duffy Bruce, MD for evaluation and management of symptomatic cholelithiasis.    PAST MEDICAL HISTORY (PMH):  Past Medical History:  Diagnosis Date  . Arthritis    all over, takes osteobiflex  . Breast cancer [facility]) 2007   Right breast  . Depression   . GERD (gastroesophageal reflux disease)   . Heart murmur    diagnosed in 40"s, no issues, mitral valve prolapse  . HTN (hypertension)    controlled on meds  . Hyperlipidemia   . Menopausal symptom   . Osteoporosis   . Personal history of radiation therapy 2007   Right - mammocyte  . Shingles 2014    Reviewed. Otherwise negative.   PAST SURGICAL HISTORY ([place]):  Past Surgical History:  Procedure Laterality Date  . ABDOMINAL HYSTERECTOMY    . ABDOMINAL HYSTERECTOMY    . APPENDECTOMY     At 74 years old  . BREAST BIOPSY Right 2007   Positive  . BREAST LUMPECTOMY  01/2006   Dx Breast Cancer  . BUNIONECTOMY    . CATARACT EXTRACTION W/PHACO Right 03/31/2019   Procedure: CATARACT EXTRACTION PHACO AND INTRAOCULAR LENS PLACEMENT ([place]) RIGHT;  Surgeon: Birder Robson, MD;  Location: [place];  Service: Ophthalmology;  Laterality: Right;  CDE 3.84 [place] 0:30.7  . CATARACT EXTRACTION W/PHACO Left 04/21/2019   Procedure: CATARACT EXTRACTION PHACO AND INTRAOCULAR LENS PLACEMENT ([place]) LEFT;  Surgeon: Birder Robson, MD;  Location: [place];  Service: Ophthalmology;  Laterality: Left;  3.62   00:28.9   48    Reviewed. Otherwise negative.   MEDICATIONS:  Prior to Admission medications   Medication Sig Start Date End Date Taking? Authorizing Provider  ARTIFICIAL TEARS 1 % ophthalmic solution  03/18/19   [provider]  escitalopram (LEXAPRO) 10 MG tablet TAKE 1 TABLET BY MOUTH  DAILY 06/23/19   Leone Haven, MD  loratadine (CLARITIN) 10 MG tablet Take 10 mg by mouth daily as needed.     [provider]  losartan-hydrochlorothiazide (HYZAAR) 100-12.5 MG tablet TAKE 1 TABLET BY MOUTH  DAILY 08/14/19   Leone Haven, MD  Multiple Vitamin (MULTIVITAMIN) tablet Take 1 tablet by mouth daily.    [provider]  PROAIR HFA 108 (90 Base) MCG/ACT inhaler TAKE 2 PUFFS BY MOUTH EVERY 6 HOURS AS NEEDED FOR WHEEZE OR SHORTNESS OF BREATH Patient not taking: TAKE 2 PUFFS BY MOUTH EVERY 6 HOURS AS NEEDED FOR WHEEZE OR SHORTNESS OF BREATH 07/21/18   Guse, Jacquelynn Cree, FNP  rosuvastatin (CRESTOR) 10 MG tablet  03/18/19   [provider]  dexlansoprazole (DEXILANT) 60 MG capsule Take 60 mg  by mouth daily. am  05/27/19  [provider]     ALLERGIES:  Allergies  Allergen Reactions  . Rosuvastatin Other (See Comments)    Severe acid reflux     SOCIAL HISTORY:  Social History   Socioeconomic History  . Marital status: Married    Spouse name: Not on file  . Number of children: 2  . Years of education: Not on file  . Highest education level: Not on file  Occupational History  . Occupation: Retired - Office/[employer]  Tobacco Use  . Smoking status: Never Smoker  . Smokeless tobacco: Never Used  Substance  and Sexual Activity  . Alcohol use: Not Currently    Comment: Occasional  . Drug use: No  . Sexual activity: Not on file  Other Topics Concern  . Not on file  Social History Narrative   Regular Exercise -  Not at this time   Daily Caffeine Use:  Diet Dr Malachi Bonds x1, Coffee x 1 cup   Social Determinants of Health   Financial Resource Strain:   . Difficulty of Paying Living Expenses:   Food Insecurity:   . Worried About Charity fundraiser in the Last Year:   . Arboriculturist in the Last Year:   Transportation Needs:   . Film/video editor (Medical):   Marland Kitchen Lack of Transportation (Non-Medical):   Physical Activity:   . Days of Exercise per Week:   . Minutes of Exercise per Session:   Stress:   . Feeling of Stress :   Social Connections:   . Frequency of Communication with Friends and Family:   . Frequency of Social Gatherings with Friends and Family:   . Attends Religious Services:   . Active Member of Clubs or Organizations:   . Attends Archivist Meetings:   Marland Kitchen Marital Status:   Intimate Partner Violence:   . Fear of Current or Ex-Partner:   . Emotionally Abused:   Marland Kitchen Physically Abused:   . Sexually Abused:      FAMILY HISTORY:  Family History  Problem Relation Age of Onset  . Breast cancer Neg Hx     Otherwise negative.   REVIEW OF SYSTEMS:  Review of Systems  Constitutional: Negative for chills and fever.  HENT: Negative for congestion and sore throat.   Respiratory: Negative for cough and shortness of breath.   Cardiovascular: Negative for chest pain and palpitations.  Gastrointestinal: Positive for abdominal pain, diarrhea, nausea and vomiting. Negative for blood in stool and constipation.  All other systems reviewed and are negative.   VITAL SIGNS:  Temp:  [97.7 F (36.5 C)] 97.7 F (36.5 C) (04/22 0851) Pulse Rate:  [56] 56 (04/22 0851) Resp:  [18] 18 (04/22 0851) BP: (129)/(67) 129/67 (04/22 0851) SpO2:  [99 %] 99 % (04/22 0851) Weight:   [68.5 kg] 68.5 kg (04/22 0854)     Height: 5\' 5"  (165.1 cm) Weight: 68.5 kg BMI (Calculated): 25.13   PHYSICAL EXAM:  Physical Exam Vitals and nursing note reviewed. Exam conducted with a chaperone present.  Constitutional:      General: She is not in acute distress.    Appearance: She is well-developed. She is obese. She is not ill-appearing.  HENT:     Head: Normocephalic and atraumatic.  Eyes:     General: No scleral icterus.    Extraocular Movements: Extraocular movements intact.  Cardiovascular:     Rate and Rhythm: Normal rate and regular rhythm.  Heart sounds: Normal heart sounds. No murmur.  Pulmonary:     Effort: Pulmonary effort is normal. No respiratory distress.     Breath sounds: Normal breath sounds.  Abdominal:     General: Abdomen is scaphoid. There is no distension.     Palpations: Abdomen is soft.     Tenderness: There is abdominal tenderness in the right upper quadrant and epigastric area. There is no guarding or rebound. Negative signs include Murphy's sign.  Genitourinary:    Comments: Deferred Skin:    General: Skin is warm and dry.     Coloration: Skin is not jaundiced or pale.  Neurological:     General: No focal deficit present.     Mental Status: She is alert and oriented to person, place, and time.  Psychiatric:        Mood and Affect: Mood normal.        Behavior: Behavior normal.     INTAKE/OUTPUT:  This shift: No intake/output data recorded.  Last 2 shifts: @IOLAST2SHIFTS @  Labs:  CBC Latest Ref Rng & Units 09/03/2019 12/09/2018 11/05/2018  WBC 4.0 - 10.5 K/uL 7.7 3.5(L) 3.8(L)  Hemoglobin 12.0 - 15.0 g/dL 13.8 14.1 13.5  Hematocrit 36.0 - 46.0 % 40.7 41.6 40.0  Platelets 150 - 400 K/uL 173 167.0 159.0   CMP Latest Ref Rng & Units 09/03/2019 03/09/2019 10/13/2018  Glucose 70 - 99 mg/dL 151(H) 97 94  BUN 8 - 23 mg/dL 19 17 16   Creatinine 0.44 - 1.00 mg/dL 0.93 0.94 0.96  Sodium 135 - 145 mmol/L 136 136 134(L)  Potassium 3.5 - 5.1 mmol/L  3.8 3.9 4.5  Chloride 98 - 111 mmol/L 100 99 98  CO2 22 - 32 mmol/L 26 30 29   Calcium 8.9 - 10.3 mg/dL 10.1 9.6 9.6  Total Protein 6.5 - 8.1 g/dL 7.5 6.8 6.4  Total Bilirubin 0.3 - 1.2 mg/dL 0.6 0.5 0.6  Alkaline Phos 38 - 126 U/L 101 105 107  AST 15 - 41 U/L 36 27 24  ALT 0 - 44 U/L 26 16 14      Imaging studies:   RUQ [place] (09/03/2019) personally reviewed showing large gallstone, and radiologist report reviewed:  IMPRESSION: 1. Large, immobile shadowing gallstone in the dependent gallbladder near the gallbladder neck. Additional sludge in the gallbladder. No gallbladder wall thickening. No pericholecystic fluid. Sonographic Percell Miller sign is not documented by sonographer. Findings are indeterminate for acute cholecystitis. Nuclear scintigraphic HIDA scan may be used to assess for patency of the cystic and common bile ducts if desired.  2.  No biliary ductal dilatation.   Assessment/Plan: (ICD-10's: K61.20) 74 y.o. female with N/V/D and RUQ abdominal concerning for symptomatic cholelithiasis with possible early cholecystitis.    - Admit to general surgery  - will plan on robotic assisted laparoscopic cholecystectomy with Dr Dahlia Byes this afternoon pending OR/Anesthesia availability   - All risks, benefits, and alternatives to above procedure(s) were discussed with the patient and her husband, all of their questions were answered to their expressed satisfaction, patient expresses she wishes to proceed, and informed consent was obtained.    - NPO + IVF Resuscitation  - will start IV Abx (Zosyn) for peri-operative coverage and given possible early cholecystitis  - Pain control prn; antiemetics prn  - Monitor abdominal examination  -  Hold home medications; restart post-op  - DVT prophylaxis; Hold for OR  All of the above findings and recommendations were discussed with the patient and her husband, and all of their  questions were answered to their expressed satisfaction.  -- Edison Simon, PA-C [facility] 09/03/2019, 1:14 PM [phone] M-F: 7am - 4pm

## 2019-09-03 NOTE — ED Provider Notes (Signed)
[facility] Emergency Department Provider Note  ____________________________________________   First MD Initiated Contact with Patient 09/03/19 1031     (approximate)  I have reviewed the triage vital signs and the nursing notes.   HISTORY  Chief Complaint Abdominal Pain, Nausea, and Emesis    HPI Katelyn Knight is a 74 y.o. female with complex past medical history as below including hypertension, hyperlipidemia, CKD, here with right upper abdominal pain.  The patient states that her symptoms began abruptly overnight.  She felt fine going to bed.  She did eat [employer] for dinner last night.  She woke up and experienced an aching, gnawing, cramping, right upper abdominal pain.  This radiates right under her right lower ribs, and occasionally towards her right shoulder.  She states it does seem worse with palpation.  She has had some nausea and vomiting and has been unable to eat or drink today due to this.  Denies any fevers.  No known history of gallstones.  She has had her appendix out.  Denies known history of coronary disease.  No leg swelling.  No history of DVT or PE.        Past Medical History:  Diagnosis Date  . Arthritis    all over, takes osteobiflex  . Breast cancer [facility]) 2007   Right breast  . Depression   . GERD (gastroesophageal reflux disease)   . Heart murmur    diagnosed in 40"s, no issues, mitral valve prolapse  . HTN (hypertension)    controlled on meds  . Hyperlipidemia   . Menopausal symptom   . Osteoporosis   . Personal history of radiation therapy 2007   Right - mammocyte  . Shingles 2014    Patient Active Problem List   Diagnosis Date Noted  . Symptomatic cholelithiasis 09/03/2019  . Anxiety 10/13/2018  . Left arm pain 10/13/2018  . Memory difficulty 10/13/2018  . Chronic kidney disease (CKD), stage III (moderate) 04/09/2018  . History of mitral valve prolapse 11/28/2017  . Knee pain 08/21/2017  . Epigastric  pain 01/22/2017  . Decreased energy 01/22/2017  . Encounter for general adult medical examination with abnormal findings 12/30/2014  . 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 Dupuytren's contracture of both hands 06/04/2012  . 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 Depression 07/11/2011  . Hypertension 05/30/2011  . Hyperlipidemia 05/30/2011  . Neuropathy 05/30/2011  . GERD (gastroesophageal reflux disease) 05/30/2011    Past Surgical History:  Procedure Laterality Date  . ABDOMINAL HYSTERECTOMY    . ABDOMINAL HYSTERECTOMY    . APPENDECTOMY     At 74 years old  . BREAST BIOPSY Right 2007   Positive  . BREAST LUMPECTOMY  01/2006   Dx Breast Cancer  . BUNIONECTOMY    . CATARACT EXTRACTION W/PHACO Right 03/31/2019   Procedure: CATARACT EXTRACTION PHACO AND INTRAOCULAR LENS PLACEMENT ([place]) RIGHT;  Surgeon: Birder Robson, MD;  Location: [place];  Service: Ophthalmology;  Laterality: Right;  CDE 3.84 [place] 0:30.7  . CATARACT EXTRACTION W/PHACO Left 04/21/2019   Procedure: CATARACT EXTRACTION PHACO AND INTRAOCULAR LENS PLACEMENT ([place]) LEFT;  Surgeon: Birder Robson, MD;  Location: [place];  Service: Ophthalmology;  Laterality: Left;  3.62   00:28.9   48    Prior to Admission medications   Medication Sig Start Date End Date Taking? Authorizing Provider  Apoaequorin (PREVAGEN EXTRA STRENGTH) 20 MG CAPS Take 1 capsule by mouth as directed.  Yes [provider]  escitalopram (LEXAPRO) 10 MG tablet TAKE 1 TABLET BY MOUTH  DAILY Patient taking differently: Take 10 mg by mouth at bedtime.  06/23/19  Yes Leone Haven, MD  losartan-hydrochlorothiazide [facility] 100-12.5 MG tablet TAKE 1 TABLET BY MOUTH  DAILY 08/14/19  Yes Leone Haven, MD  Misc Natural Products (OSTEO BI-FLEX ADV DOUBLE ST) TABS Take 1 tablet by mouth as directed.   Yes [provider]  dexlansoprazole (DEXILANT) 60 MG capsule Take 60 mg  by mouth daily. am  05/27/19  [provider]    Allergies Rosuvastatin  Family History  Problem Relation Age of Onset  . Breast cancer Neg Hx     Social History Social History   Tobacco Use  . Smoking status: Never Smoker  . Smokeless tobacco: Never Used  Substance Use Topics  . Alcohol use: Not Currently    Comment: Occasional  . Drug use: No    Review of Systems  Review of Systems  Constitutional: Positive for fatigue. Negative for fever.  HENT: Negative for congestion and sore throat.   Eyes: Negative for visual disturbance.  Respiratory: Negative for cough and shortness of breath.   Cardiovascular: Negative for chest pain.  Gastrointestinal: Positive for abdominal pain, nausea and vomiting. Negative for diarrhea.  Genitourinary: Negative for flank pain.  Musculoskeletal: Negative for back pain and neck pain.  Skin: Negative for rash and wound.  Neurological: Negative for weakness.  All other systems reviewed and are negative.    ____________________________________________  PHYSICAL EXAM:      VITAL SIGNS: ED Triage Vitals  Enc Vitals Group     BP 09/03/19 0851 129/67     Pulse Rate 09/03/19 0851 (!) 56     Resp 09/03/19 0851 18     Temp 09/03/19 0851 97.7 F (36.5 C)     Temp Source 09/03/19 0851 Oral     SpO2 09/03/19 0851 99 %     Weight 09/03/19 0854 151 lb (68.5 kg)     Height 09/03/19 0854 5\' 5"  (1.651 m)     Head Circumference --      Peak Flow --      Pain Score 09/03/19 0853 9     Pain Loc --      Pain Edu? --      Excl. in [place]? --      Physical Exam Vitals and nursing note reviewed.  Constitutional:      General: She is not in acute distress.    Appearance: She is well-developed.  HENT:     Head: Normocephalic and atraumatic.  Eyes:     Conjunctiva/sclera: Conjunctivae normal.  Cardiovascular:     Rate and Rhythm: Normal rate and regular rhythm.     Heart sounds: Normal heart sounds. No murmur. No friction rub.    Pulmonary:     Effort: Pulmonary effort is normal. No respiratory distress.     Breath sounds: Normal breath sounds. No wheezing or rales.  Abdominal:     General: There is no distension.     Palpations: Abdomen is soft.     Tenderness: There is abdominal tenderness in the right upper quadrant. Positive signs include Murphy's sign.  Musculoskeletal:     Cervical back: Neck supple.  Skin:    General: Skin is warm.     Capillary Refill: Capillary refill takes less than 2 seconds.  Neurological:     Mental Status: She is alert and oriented to person, place,  and time.     Motor: No abnormal muscle tone.       ____________________________________________   LABS (all labs ordered are listed, but only abnormal results are displayed)  Labs Reviewed  COMPREHENSIVE METABOLIC PANEL - Abnormal; Notable for the following components:      Result Value   Glucose, Bld 151 (*)    GFR calc non Af Amer 60 (*)    All other components within normal limits  URINALYSIS, COMPLETE (UACMP) WITH MICROSCOPIC - Abnormal; Notable for the following components:   Color, Urine YELLOW (*)    APPearance HAZY (*)    Leukocytes,Ua TRACE (*)    All other components within normal limits  RESPIRATORY PANEL BY RT PCR (FLU A&B, COVID)  LIPASE, BLOOD  CBC  TROPONIN I (HIGH SENSITIVITY)  TROPONIN I (HIGH SENSITIVITY)    ____________________________________________  EKG: Sinus bradycardia, ventricular rate 54.  PR 134, QRS 70, QTc 422.  T wave inverted in lead III and aVF, otherwise no ST elevations or reciprocal changes. ________________________________________  RADIOLOGY All imaging, including plain films, CT scans, and ultrasounds, independently reviewed by me, and interpretations confirmed via formal radiology reads.  ED MD interpretation:   RUQ U/S: Large gallstone with sludge, no GB wall thickening, indeterminate for cholecystitis   Official radiology report(s): DG Chest 2 View  Result Date:  09/03/2019 CLINICAL DATA:  Chest pain. EXAM: CHEST - 2 VIEW COMPARISON:  February 19, 2006. FINDINGS: The heart size and mediastinal contours are within normal limits. Atherosclerosis of thoracic aorta is noted. Hypoinflation of the lungs is noted. Both lungs are clear. The visualized skeletal structures are unremarkable. IMPRESSION: No active cardiopulmonary disease. Aortic Atherosclerosis (ICD10-I70.0). Electronically Signed   By: Marijo Conception M.D.   On: 09/03/2019 12:02   US Abdomen Limited RUQ  Result Date: 09/03/2019 CLINICAL DATA:  Right upper quadrant pain EXAM: ULTRASOUND ABDOMEN LIMITED RIGHT UPPER QUADRANT COMPARISON:  None. FINDINGS: Gallbladder: Large, immobile shadowing gallstone in the dependent gallbladder near the gallbladder neck. Additional sludge in the gallbladder. No gallbladder wall thickening. No pericholecystic fluid. Sonographic Percell Miller sign is not documented by sonographer. Common bile duct: Diameter: 5 mm Liver: No focal lesion identified. Within normal limits in parenchymal echogenicity. Portal vein is patent on color Doppler imaging with normal direction of blood flow towards the liver. Other: None. IMPRESSION: 1. Large, immobile shadowing gallstone in the dependent gallbladder near the gallbladder neck. Additional sludge in the gallbladder. No gallbladder wall thickening. No pericholecystic fluid. Sonographic Percell Miller sign is not documented by sonographer. Findings are indeterminate for acute cholecystitis. Nuclear scintigraphic HIDA scan may be used to assess for patency of the cystic and common bile ducts if desired. 2.  No biliary ductal dilatation. Electronically Signed   By: Eddie Candle M.D.   On: 09/03/2019 12:21    ____________________________________________  PROCEDURES   Procedure(s) performed (including Critical Care):  Procedures  ____________________________________________  INITIAL IMPRESSION / MDM / [place] / ED COURSE  As part of my medical  decision making, I reviewed the following data within the [place] notes reviewed and incorporated, Old chart reviewed, Notes from prior ED visits, and Center Controlled Substance Database       *OLENA ROLLER was evaluated in Emergency Department on 09/03/2019 for the symptoms described in the history of present illness. She was evaluated in the context of the global COVID-19 pandemic, which necessitated consideration that the patient might be at risk for infection with the SARS-CoV-2 virus  that causes COVID-19. Institutional protocols and algorithms that pertain to the evaluation of patients at risk for COVID-19 are in a state of rapid change based on information released by regulatory bodies including the CDC and federal and state organizations. These policies and algorithms were followed during the patient's care in the ED.  Some ED evaluations and interventions may be delayed as a result of limited staffing during the pandemic.*     Medical Decision Making:  74 yo F here with RUQ pain, nausea, vomiting. Labs reviewed and are reassuring, with normal WBC, normal LFTs and bili. No sx to suggest choledocholithiasis. RUQ U/S obtained shows large stone in GB neck and I suspect her sx are from symptomatic chole vs early cholecystitis. Will keep her NPO, consult Surgery.  ____________________________________________  FINAL CLINICAL IMPRESSION(S) / ED DIAGNOSES  Final diagnoses:  RUQ pain  Symptomatic cholelithiasis     MEDICATIONS GIVEN DURING THIS VISIT:  Medications  0.9 %  sodium chloride infusion ( Intravenous New Bag/Given 09/03/19 1528)  piperacillin-tazobactam (ZOSYN) IVPB 3.375 g ( Intravenous Automatically Held 09/11/19 2200)  acetaminophen (TYLENOL) tablet 1,000 mg ( Oral Automatically Held 09/11/19 1800)  morphine 2 MG/ML injection 2-4 mg ( Intravenous MAR Hold 09/03/19 1804)  ondansetron (ZOFRAN-ODT) disintegrating tablet 4 mg ( Oral MAR Hold 09/03/19 1804)     Or  ondansetron (ZOFRAN) injection 4 mg ( Intravenous MAR Hold 09/03/19 1804)  ondansetron (ZOFRAN-ODT) disintegrating tablet 4 mg (4 mg Oral Given 09/03/19 0859)  morphine 4 MG/ML injection 4 mg (4 mg Intravenous Given 09/03/19 1221)  ondansetron (ZOFRAN) injection 4 mg (4 mg Intravenous Given 09/03/19 1219)  0.9 %  sodium chloride infusion ( Intravenous New Bag/Given 09/03/19 1218)  indocyanine green (IC-GREEN) injection 5 mg (5 mg Intravenous Given 09/03/19 1745)     ED Discharge Orders    None       Note:  This document was prepared using Dragon voice recognition software and may include unintentional dictation errors.   Duffy Bruce, MD 09/03/19 [phone]

## 2019-09-04 LAB — COMPREHENSIVE METABOLIC PANEL
ALT: 190 U/L — ABNORMAL HIGH (ref 0–44)
AST: 262 U/L — ABNORMAL HIGH (ref 15–41)
Albumin: 3.7 g/dL (ref 3.5–5.0)
Alkaline Phosphatase: 70 U/L (ref 38–126)
Anion gap: 11 (ref 5–15)
BUN: 23 mg/dL (ref 8–23)
CO2: 22 mmol/L (ref 22–32)
Calcium: 8.5 mg/dL — ABNORMAL LOW (ref 8.9–10.3)
Chloride: 107 mmol/L (ref 98–111)
Creatinine, Ser: 1.09 mg/dL — ABNORMAL HIGH (ref 0.44–1.00)
GFR calc Af Amer: 57 mL/min — ABNORMAL LOW (ref >60)
GFR calc non Af Amer: 49 mL/min — ABNORMAL LOW (ref >60)
Glucose, Bld: 182 mg/dL — ABNORMAL HIGH (ref 70–99)
Potassium: 3.9 mmol/L (ref 3.5–5.1)
Sodium: 140 mmol/L (ref 135–145)
Total Bilirubin: 0.9 mg/dL (ref 0.3–1.2)
Total Protein: 6.1 g/dL — ABNORMAL LOW (ref 6.5–8.1)

## 2019-09-04 LAB — CBC
HCT: 36.9 % (ref 36.0–46.0)
Hemoglobin: 12.7 g/dL (ref 12.0–15.0)
MCH: 32.1 pg (ref 26.0–34.0)
MCHC: 34.4 g/dL (ref 30.0–36.0)
MCV: 93.2 fL (ref 80.0–100.0)
Platelets: 157 10*3/uL (ref 150–400)
RBC: 3.96 MIL/uL (ref 3.87–5.11)
RDW: 13.3 % (ref 11.5–15.5)
WBC: 9 10*3/uL (ref 4.0–10.5)
nRBC: 0 % (ref 0.0–0.2)

## 2019-09-04 MED ORDER — AMOXICILLIN-POT CLAVULANATE 875-125 MG PO TABS: 1.0000 | ORAL_TABLET | Freq: Two times a day (BID) | ORAL | 0 refills | Status: AC

## 2019-09-04 MED ORDER — HYDROCODONE-ACETAMINOPHEN 5-325 MG PO TABS: 1.0000 | ORAL_TABLET | Freq: Four times a day (QID) | ORAL | 0 refills | Status: DC | PRN

## 2019-09-04 NOTE — Transfer of Care (Signed)
Immediate Anesthesia Transfer of Care Note  Patient: Katelyn Knight  Procedure(s) Performed: HAND ASSISTED LAPAROSCOPIC CHOLECYSTECTOMY (N/A Abdomen)  Patient Location: PACU  Anesthesia Type:General  Level of Consciousness: sedated  Airway & Oxygen Therapy: Patient Spontanous Breathing and Patient connected to face mask oxygen  Post-op Assessment: Report given to RN and Post -op Vital signs reviewed and stable  Post vital signs: Reviewed and stable  Last Vitals:  Vitals Value Taken Time  BP 133/63 09/03/19 2359  Temp 37 C 09/03/19 2359  Pulse 73 09/03/19 2359  Resp 20 09/03/19 2359  SpO2 97 % 09/03/19 2359    Last Pain:  Vitals:   09/03/19 2359  TempSrc: Oral  PainSc:          Complications: No apparent anesthesia complications

## 2019-09-04 NOTE — Discharge Summary (Signed)
[facility] SURGICAL DISCHARGE SUMMARY  Patient ID: Katelyn Knight MRN: [id number] DOB/AGE: 1945-12-21 74 y.o.  Admit date: 09/03/2019 Discharge date: 09/04/2019  Discharge Diagnoses Patient Active Problem List   Diagnosis Date Noted  . Gangrenous Cholecystitis  09/03/2019   Consultants None  Procedures 09/03/2019:  Hand assisted laparoscopic cholecystectomy  HPI: 74 y.o. female presented to [facility] ED today for abdominal pain. Patient reports that around 3AM last night she woke up with a sharp burning RUQ abdominal pain. This radiated under her ribs and towards her should. She notes that the pain has been constant and essentially unchanged since the onset. She denied any history of similar or less severe pain in the past. She endorsed associated nausea, emesis, and diarrhea since the onset of pain. No fever, chills, cough, congestion, CP, SOB, jaundice, urinary changes, or acholic stools. She has had a hysterectomy and appendectomy in the past. Work up in the ED was concerning for a large immobile gallstone on [place]. Pain control was attempted in the ED but unsuccessful.   Hospital Course: Informed consent was obtained and documented, and patient underwent uneventful hand-assisted laparoscopic cholecystectomy (Dr Dahlia Byes, 09/03/2019).  Post-operatively, patient's pain and symptoms improved/resolved and advancement of patient's diet and ambulation were well-tolerated. The remainder of patient's hospital course was essentially unremarkable, and discharge planning was initiated accordingly with patient safely able to be discharged home with appropriate discharge instructions, antibiotics (Augmentin x5 days), pain control, and outpatient= follow-up after all of her and her family's questions were answered to her expressed satisfaction.   Discharge Condition: Good   Physical Examination:  Constitutional: Well appearing female, NAD Pulmonary: Normal effort, no respiratory  distress Gastrointestinal: Soft, incisional soreness, non-distended, no rebound or guarding Skin: Laparoscopic and mini laparotomy incision is CDI with staples and honeycomb   Allergies as of 09/04/2019      Reactions   Rosuvastatin Other (See Comments)   Severe acid reflux      Medication List    TAKE these medications   amoxicillin-clavulanate 875-125 MG tablet Commonly known as: Augmentin Take 1 tablet by mouth 2 (two) times daily for 5 days.   escitalopram 10 MG tablet Commonly known as: LEXAPRO TAKE 1 TABLET BY MOUTH  DAILY What changed: when to take this   HYDROcodone-acetaminophen 5-325 MG tablet Commonly known as: NORCO/VICODIN Take 1 tablet by mouth every 6 (six) hours as needed for moderate pain.   losartan-hydrochlorothiazide 100-12.5 MG tablet Commonly known as: HYZAAR TAKE 1 TABLET BY MOUTH  DAILY   Osteo Bi-Flex Adv Double St Tabs Take 1 tablet by mouth as directed.   Prevagen Extra Strength 20 MG Caps Generic drug: Apoaequorin Take 1 capsule by mouth as directed.            Discharge Care Instructions  (From admission, onward)         Start     Ordered   09/04/19 0000  Discharge wound care:    Comments: Remove dressing on 04/25   09/04/19 [id number]           Follow-up Information    Pabon, Marjory Lies, MD. Schedule an appointment as soon as possible for a visit on 09/09/2019.   Specialty: General Surgery Why: s/p lap chole, has drain Contact information: [address] [place] [address] [phone]            Time spent on discharge management including discussion of hospital course, clinical condition, outpatient instructions, prescriptions, and follow up with the patient  and members of the medical team: >30 minutes  -- Edison Simon , PA-C [facility]  09/04/2019, 9:28 AM [phone] M-F: 7am - 4pm

## 2019-09-04 NOTE — Discharge Instructions (Signed)
In addition to included general post-operative instructions for choelcystectomy,  Diet: Resume home heart healthy diet.   Activity: No heavy lifting >20 pounds (children, pets, laundry, garbage) for 4-6 weeks, but light activity and walking are encouraged. Do not drive or drink alcohol if taking narcotic pain medications or having pain that might distract from driving.  Wound care: Remove dressing on 04/25, staples are okay to be open to the air. Once dressing remove, you may shower/get incision wet with soapy water and pat dry (do not rub incisions), but no baths or submerging incision underwater until follow-up.   Medications: Resume all home medications. For mild to moderate pain: acetaminophen (Tylenol) or ibuprofen/naproxen (if no kidney disease). Combining Tylenol with alcohol can substantially increase your risk of causing liver disease. Narcotic pain medications, if prescribed, can be used for severe pain, though may cause nausea, constipation, and drowsiness. Do not combine Tylenol and Percocet (or similar) within a 6 hour period as Percocet (and similar) contain(s) Tylenol. If you do not need the narcotic pain medication, you do not need to fill the prescription.  Call office [phone] / [phone]) at any time if any questions, worsening pain, fevers/chills, bleeding, drainage from incision site, or other concerns.

## 2019-09-04 NOTE — Care Management Obs Status (Signed)
[facility] NOTIFICATION   Patient Details  Name: Katelyn Knight MRN: [id number] Date of Birth: 05-09-1946   Medicare Observation Status Notification Given:  No(Admitted obs less thand 24 hours)    Beverly Sessions, RN 09/04/2019, 9:38 AM

## 2019-09-04 NOTE — Anesthesia Postprocedure Evaluation (Signed)
Anesthesia Post Note  Patient: Katelyn Knight  Procedure(s) Performed: HAND ASSISTED LAPAROSCOPIC CHOLECYSTECTOMY (N/A Abdomen)  Patient location during evaluation: PACU Anesthesia Type: General Level of consciousness: awake and alert Pain management: pain level controlled Vital Signs Assessment: post-procedure vital signs reviewed and stable Respiratory status: spontaneous breathing, nonlabored ventilation and respiratory function stable Cardiovascular status: blood pressure returned to baseline and stable Postop Assessment: no apparent nausea or vomiting Anesthetic complications: no     Last Vitals:  Vitals:   09/03/19 2259 09/03/19 2359  BP: 138/68 133/63  Pulse: 74 73  Resp: 16 20  Temp: 36.8 C 37 C  SpO2: 96% 97%    Last Pain:  Vitals:   09/03/19 2359  TempSrc: Oral  PainSc:                  Tera Mater

## 2019-09-07 LAB — SURGICAL PATHOLOGY

## 2019-09-08 ENCOUNTER — Ambulatory Visit: Payer: Medicare Other | Admitting: Family Medicine

## 2019-09-09 ENCOUNTER — Ambulatory Visit (INDEPENDENT_AMBULATORY_CARE_PROVIDER_SITE_OTHER): Payer: Medicare Other | Admitting: Surgery

## 2019-09-09 ENCOUNTER — Telehealth: Payer: Self-pay | Admitting: Emergency Medicine

## 2019-09-09 ENCOUNTER — Other Ambulatory Visit: Payer: Self-pay

## 2019-09-09 ENCOUNTER — Encounter: Payer: Self-pay | Admitting: Surgery

## 2019-09-09 VITALS — BP 146/78 | HR 66 | Temp 97.9°F | Resp 12 | Wt 154.2 lb

## 2019-09-09 DIAGNOSIS — K219 Gastro-esophageal reflux disease without esophagitis: Secondary | ICD-10-CM

## 2019-09-09 NOTE — Telephone Encounter (Signed)
Pt scheduled for Barium Swallow 10/08/19 at 12pm at the [facility]. Arrival 11:45am. Nothing to eat or drink 3 hours prior.   F/u appt with Dr Dahlia Byes 10/14/19 at 9:15am.  Pt aware of above appts. Pt voiced understanding.

## 2019-09-09 NOTE — Progress Notes (Signed)
Outpatient Surgical Follow Up  09/09/2019  Katelyn Knight is an 74 y.o. female.   Chief Complaint  Patient presents with  . Routine Post Op    HAND ASSISTED LAPAROSCOPIC CHOLECYSTECTOMY has drain dr Zebulen Simonis 09/03/19    HPI: Katelyn Knight is a 74 year old female status post hand-assisted cholecystectomy last week she is doing very well.  No fevers no chills.  Some minimal abdominal discomfort related to her surgery.  She now comes for reflux symptoms.  She has had them for a while.  She has seen Dr. Vira Agar in the past with it EGD unfortunately the results are not available to me.  She was previously on PPI but now has stopped.  She is done doing some herbal medicines that actually have improved some.  Some occasional intermittent dysphagia.  Past Medical History:  Diagnosis Date  . Arthritis    all over, takes osteobiflex  . Breast cancer [facility]) 2007   Right breast  . Depression   . GERD (gastroesophageal reflux disease)   . Heart murmur    diagnosed in 40"s, no issues, mitral valve prolapse  . HTN (hypertension)    controlled on meds  . Hyperlipidemia   . Menopausal symptom   . Osteoporosis   . Personal history of radiation therapy 2007   Right - mammocyte  . Shingles 2014    Past Surgical History:  Procedure Laterality Date  . ABDOMINAL HYSTERECTOMY    . ABDOMINAL HYSTERECTOMY    . APPENDECTOMY     At 74 years old  . BREAST BIOPSY Right 2007   Positive  . BREAST LUMPECTOMY  01/2006   Dx Breast Cancer  . BUNIONECTOMY    . CATARACT EXTRACTION W/PHACO Right 03/31/2019   Procedure: CATARACT EXTRACTION PHACO AND INTRAOCULAR LENS PLACEMENT ([place]) RIGHT;  Surgeon: Birder Robson, MD;  Location: [place];  Service: Ophthalmology;  Laterality: Right;  CDE 3.84 [place] 0:30.7  . CATARACT EXTRACTION W/PHACO Left 04/21/2019   Procedure: CATARACT EXTRACTION PHACO AND INTRAOCULAR LENS PLACEMENT ([place]) LEFT;  Surgeon: Birder Robson, MD;  Location: [place];  Service:  Ophthalmology;  Laterality: Left;  3.62   00:28.9   48    Family History  Problem Relation Age of Onset  . Breast cancer Neg Hx     Social History:  reports that she has never smoked. She has never used smokeless tobacco. She reports previous alcohol use. She reports that she does not use drugs.  Allergies:  Allergies  Allergen Reactions  . Rosuvastatin Other (See Comments)    Severe acid reflux    Medications reviewed.    ROS Full ROS performed and is otherwise negative other than what is stated in HPI   BP (!) 146/78   Pulse 66   Temp 97.9 F (36.6 C)   Resp 12   Wt 154 lb 3.2 oz (69.9 kg)   SpO2 97%   BMI 25.66 kg/m   Physical Exam Vitals and nursing note reviewed. Exam conducted with a chaperone present.  Constitutional:      General: She is not in acute distress.    Appearance: Normal appearance. She is normal weight.  Cardiovascular:     Rate and Rhythm: Normal rate and regular rhythm.  Pulmonary:     Effort: Pulmonary effort is normal. No respiratory distress.     Breath sounds: No stridor.  Abdominal:     General: Abdomen is flat. There is no distension.     Palpations: There is no mass.  Tenderness: There is no abdominal tenderness.     Hernia: No hernia is present.     Comments: Incision healing well staples in place.  No evidence of infection.  Drain removed without any issues  Musculoskeletal:     Cervical back: Normal range of motion and neck supple. No rigidity or tenderness.  Skin:    General: Skin is warm and dry.     Capillary Refill: Capillary refill takes less than 2 seconds.  Neurological:     General: No focal deficit present.     Mental Status: She is alert and oriented to person, place, and time.  Psychiatric:        Mood and Affect: Mood normal.        Behavior: Behavior normal.        Thought Content: Thought content normal.      Assessment/Plan:  74 year old female status post hand-assisted cholecystectomy now presents  with significant reflux.  I have personally reviewed their work-up from before and notes from GI.  We will start our on work-up to include a barium swallow.  She may need a repeat endoscopic evaluation.  Please note that the visit mainly concentrated in managing of her reflux and not necessarily related to her postoperative course. From the gallbladder perspective we will have her come back next week for nursing visit to remove the staples. Greater than 50% of the 25 minutes  visit was spent in counseling/coordination of care   Caroleen Hamman, MD [employer] Surgeon

## 2019-09-09 NOTE — Progress Notes (Signed)
barium

## 2019-09-09 NOTE — Patient Instructions (Addendum)
Dr Dahlia Byes has removed your drain today and placed a dry gauze over the area. You may change this gauze as needed to help with oozing.   We will see you back in one week for a nurse visit to remove the staples.   We will schedule a Barium Swallow Study. We will call you with appointment dates.  Please call the office if you have any questions or concerns.   GENERAL POST-OPERATIVE PATIENT INSTRUCTIONS   WOUND CARE INSTRUCTIONS:  Keep a dry clean dressing on the wound if there is drainage. The initial bandage may be removed after 24 hours.  Once the wound has quit draining you may leave it open to air.  If clothing rubs against the wound or causes irritation and the wound is not draining you may cover it with a dry dressing during the daytime.  Try to keep the wound dry and avoid ointments on the wound unless directed to do so.  If the wound becomes bright red and painful or starts to drain infected material that is not clear, please contact your physician immediately.  If the wound is mildly pink and has a thick firm ridge underneath it, this is normal, and is referred to as a healing ridge.  This will resolve over the next 4-6 weeks.  BATHING: You may shower if you have been informed of this by your surgeon. However, Please do not submerge in a tub, hot tub, or pool until incisions are completely sealed or have been told by your surgeon that you may do so.  DIET:  You may eat any foods that you can tolerate.  It is a good idea to eat a high fiber diet and take in plenty of fluids to prevent constipation.  If you do become constipated you may want to take a mild laxative or take ducolax tablets on a daily basis until your bowel habits are regular.  Constipation can be very uncomfortable, along with straining, after recent surgery.  ACTIVITY:  You are encouraged to cough and deep breath or use your incentive spirometer if you were given one, every 15-30 minutes when awake.  This will help prevent  respiratory complications and low grade fevers post-operatively if you had a general anesthetic.  You may want to hug a pillow when coughing and sneezing to add additional support to the surgical area, if you had abdominal or chest surgery, which will decrease pain during these times.  You are encouraged to walk and engage in light activity for the next two weeks.  You should not lift more than 20 pounds, until 10/01/2019 as it could put you at increased risk for complications.  Twenty pounds is roughly equivalent to a plastic bag of groceries. At that time- Listen to your body when lifting, if you have pain when lifting, stop and then try again in a few days. Soreness after doing exercises or activities of daily living is normal as you get back in to your normal routine.  MEDICATIONS:  Try to take narcotic medications and anti-inflammatory medications, such as tylenol, ibuprofen, naprosyn, etc., with food.  This will minimize stomach upset from the medication.  Should you develop nausea and vomiting from the pain medication, or develop a rash, please discontinue the medication and contact your physician.  You should not drive, make important decisions, or operate machinery when taking narcotic pain medication.  SUNBLOCK Use sun block to incision area over the next year if this area will be exposed to sun. This  helps decrease scarring and will allow you avoid a permanent darkened area over your incision.  QUESTIONS:  Please feel free to call our office if you have any questions, and we will be glad to assist you.

## 2019-09-16 ENCOUNTER — Other Ambulatory Visit: Payer: Self-pay

## 2019-09-16 ENCOUNTER — Ambulatory Visit (INDEPENDENT_AMBULATORY_CARE_PROVIDER_SITE_OTHER): Payer: Self-pay

## 2019-09-16 DIAGNOSIS — K219 Gastro-esophageal reflux disease without esophagitis: Secondary | ICD-10-CM

## 2019-09-16 NOTE — Progress Notes (Signed)
Patient came in today for a wound check.  The wound is clean, with no signs of infection noted. Staples removed and steri strips applied. Follow up as scheduled.

## 2019-09-16 NOTE — Patient Instructions (Signed)
Follow up as scheduled.  

## 2019-10-08 ENCOUNTER — Telehealth: Payer: Self-pay

## 2019-10-08 ENCOUNTER — Ambulatory Visit
Admission: RE | Admit: 2019-10-08 | Discharge: 2019-10-08 | Disposition: A | Discharge status: Home / Self Care | Payer: Medicare Other | Source: Ambulatory Visit | Attending: Surgery | Admitting: Surgery

## 2019-10-08 DIAGNOSIS — K219 Gastro-esophageal reflux disease without esophagitis: Secondary | ICD-10-CM | POA: Diagnosis not present

## 2019-10-08 NOTE — Telephone Encounter (Signed)
Patient notified of recent CT results. Patient states she has asked several doctors before if she had a hernia located in that area and was always told no. Patient asked if she needed to keep appointment for [place]. I advised her to do so, so that Dr.Pabon could go over the CT thoroughly and answer any questions she may have about the next steps. Patient verbalizes understanding and has no further questions.

## 2019-10-08 NOTE — Telephone Encounter (Signed)
-----   Message from Jules Husbands, MD sent at 10/08/2019  2:45 PM EDT ----- Please let her know that she does have a hiatal hernia but no other surprises ----- Message ----- From: Interface, Rad Results In Sent: 10/08/2019   1:23 PM EDT To: Jules Husbands, MD

## 2019-10-14 ENCOUNTER — Other Ambulatory Visit: Payer: Self-pay

## 2019-10-14 ENCOUNTER — Encounter: Payer: Self-pay | Admitting: Surgery

## 2019-10-14 ENCOUNTER — Ambulatory Visit (INDEPENDENT_AMBULATORY_CARE_PROVIDER_SITE_OTHER): Payer: Medicare Other | Admitting: Surgery

## 2019-10-14 VITALS — BP 122/73 | HR 59 | Temp 97.7°F | Resp 12 | Ht 65.0 in | Wt 150.0 lb

## 2019-10-14 DIAGNOSIS — K219 Gastro-esophageal reflux disease without esophagitis: Secondary | ICD-10-CM

## 2019-10-14 NOTE — Patient Instructions (Addendum)
Please call our office if you have questions or concerns. If you decide to move forward with having additional testing please contact our office to schedule an appointment.

## 2019-10-14 NOTE — Progress Notes (Signed)
Outpatient Surgical Follow Up  10/14/2019  Katelyn Knight is an 74 y.o. female.   Chief Complaint  Patient presents with  . Follow-up    discuss test results    HPI: Is a 74 year old female with a prior cholecystectomy and persistent reflux symptoms.  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  There is no other concerning findings.  She started some supplements that are natural with significant improvement.  No fevers no chills she is swallowing well.  Past Medical History:  Diagnosis Date  . Arthritis    all over, takes osteobiflex  . Breast cancer [facility]) 2007   Right breast  . Depression   . GERD (gastroesophageal reflux disease)   . Heart murmur    diagnosed in 40"s, no issues, mitral valve prolapse  . HTN (hypertension)    controlled on meds  . Hyperlipidemia   . Menopausal symptom   . Osteoporosis   . Personal history of radiation therapy 2007   Right - mammocyte  . Shingles 2014    Past Surgical History:  Procedure Laterality Date  . ABDOMINAL HYSTERECTOMY    . ABDOMINAL HYSTERECTOMY    . APPENDECTOMY     At 74 years old  . BREAST BIOPSY Right 2007   Positive  . BREAST LUMPECTOMY  01/2006   Dx Breast Cancer  . BUNIONECTOMY    . CATARACT EXTRACTION W/PHACO Right 03/31/2019   Procedure: CATARACT EXTRACTION PHACO AND INTRAOCULAR LENS PLACEMENT ([place]) RIGHT;  Surgeon: Birder Robson, MD;  Location: [place];  Service: Ophthalmology;  Laterality: Right;  CDE 3.84 [place] 0:30.7  . CATARACT EXTRACTION W/PHACO Left 04/21/2019   Procedure: CATARACT EXTRACTION PHACO AND INTRAOCULAR LENS PLACEMENT ([place]) LEFT;  Surgeon: Birder Robson, MD;  Location: [place];  Service: Ophthalmology;  Laterality: Left;  3.62   00:28.9   48    Family History  Problem Relation Age of Onset  . Heart disease Father   . Breast cancer Neg Hx     Social History:  reports that she has  never smoked. She has never used smokeless tobacco. She reports previous alcohol use. She reports that she does not use drugs.  Allergies:  Allergies  Allergen Reactions  . Rosuvastatin Other (See Comments)    Severe acid reflux    Medications reviewed.    ROS Full ROS performed and is otherwise negative other than what is stated in HPI   BP 122/73   Pulse (!) 59   Temp 97.7 F (36.5 C) (Temporal)   Resp 12   Ht 5\' 5"  (1.651 m)   Wt 150 lb (68 kg)   SpO2 98%   BMI 24.96 kg/m   Physical Exam NAD, alert Abd: soft, prior incision healed . No infection, hernia or peritonitis Ext: well perfused and no edema  Assessment/Plan:  1. Gastroesophageal reflux disease, she reports that she has taken some natural supplements for her reflux and has improved dramatically.  The patient does not wish to be on any PPI.  I also offer her to do an endoscopic evaluation and currently the patient declines.  We will see her on a as needed basis     Greater than 50% of the 25 minutes  visit was spent in counseling/coordination of care   Caroleen Hamman, MD [place] Surgeon

## 2019-10-16 ENCOUNTER — Encounter: Payer: Self-pay | Admitting: *Deleted

## 2019-12-02 DIAGNOSIS — H35373 Puckering of macula, bilateral: Secondary | ICD-10-CM | POA: Diagnosis not present

## 2019-12-21 DIAGNOSIS — M1611 Unilateral primary osteoarthritis, right hip: Secondary | ICD-10-CM | POA: Diagnosis not present

## 2019-12-21 DIAGNOSIS — M25551 Pain in right hip: Secondary | ICD-10-CM | POA: Diagnosis not present

## 2020-01-13 DIAGNOSIS — M1611 Unilateral primary osteoarthritis, right hip: Secondary | ICD-10-CM | POA: Diagnosis not present

## 2020-01-21 ENCOUNTER — Other Ambulatory Visit: Payer: Self-pay | Admitting: Orthopedic Surgery

## 2020-01-22 DIAGNOSIS — K219 Gastro-esophageal reflux disease without esophagitis: Secondary | ICD-10-CM | POA: Diagnosis not present

## 2020-01-22 DIAGNOSIS — Z79899 Other long term (current) drug therapy: Secondary | ICD-10-CM | POA: Diagnosis not present

## 2020-01-22 DIAGNOSIS — M1611 Unilateral primary osteoarthritis, right hip: Secondary | ICD-10-CM | POA: Diagnosis not present

## 2020-01-22 DIAGNOSIS — E7800 Pure hypercholesterolemia, unspecified: Secondary | ICD-10-CM | POA: Diagnosis not present

## 2020-01-22 DIAGNOSIS — I10 Essential (primary) hypertension: Secondary | ICD-10-CM | POA: Diagnosis not present

## 2020-01-26 DIAGNOSIS — M1611 Unilateral primary osteoarthritis, right hip: Secondary | ICD-10-CM | POA: Diagnosis not present

## 2020-01-28 ENCOUNTER — Other Ambulatory Visit: Payer: Self-pay

## 2020-01-28 ENCOUNTER — Encounter
Admission: RE | Admit: 2020-01-28 | Discharge: 2020-01-28 | Disposition: A | Discharge status: Home / Self Care | Payer: Medicare Other | Source: Ambulatory Visit | Attending: Orthopedic Surgery | Admitting: Orthopedic Surgery

## 2020-01-28 DIAGNOSIS — Z01812 Encounter for preprocedural laboratory examination: Secondary | ICD-10-CM | POA: Insufficient documentation

## 2020-01-28 LAB — CBC WITH DIFFERENTIAL/PLATELET
Abs Immature Granulocytes: 0.01 10*3/uL (ref 0.00–0.07)
Basophils Absolute: 0 10*3/uL (ref 0.0–0.1)
Basophils Relative: 1 %
Eosinophils Absolute: 0.1 10*3/uL (ref 0.0–0.5)
Eosinophils Relative: 2 %
HCT: 42.8 % (ref 36.0–46.0)
Hemoglobin: 14.8 g/dL (ref 12.0–15.0)
Immature Granulocytes: 0 %
Lymphocytes Relative: 24 %
Lymphs Abs: 1.3 10*3/uL (ref 0.7–4.0)
MCH: 31.4 pg (ref 26.0–34.0)
MCHC: 34.6 g/dL (ref 30.0–36.0)
MCV: 90.9 fL (ref 80.0–100.0)
Monocytes Absolute: 0.6 10*3/uL (ref 0.1–1.0)
Monocytes Relative: 11 %
Neutro Abs: 3.4 10*3/uL (ref 1.7–7.7)
Neutrophils Relative %: 62 %
Platelets: 166 10*3/uL (ref 150–400)
RBC: 4.71 MIL/uL (ref 3.87–5.11)
RDW: 13.5 % (ref 11.5–15.5)
WBC: 5.3 10*3/uL (ref 4.0–10.5)
nRBC: 0 % (ref 0.0–0.2)

## 2020-01-28 LAB — COMPREHENSIVE METABOLIC PANEL
ALT: 14 U/L (ref 0–44)
AST: 28 U/L (ref 15–41)
Albumin: 4.4 g/dL (ref 3.5–5.0)
Alkaline Phosphatase: 104 U/L (ref 38–126)
Anion gap: 11 (ref 5–15)
BUN: 17 mg/dL (ref 8–23)
CO2: 27 mmol/L (ref 22–32)
Calcium: 10 mg/dL (ref 8.9–10.3)
Chloride: 98 mmol/L (ref 98–111)
Creatinine, Ser: 0.89 mg/dL (ref 0.44–1.00)
GFR calc Af Amer: >60 mL/min (ref >60)
GFR calc non Af Amer: >60 mL/min (ref >60)
Glucose, Bld: 99 mg/dL (ref 70–99)
Potassium: 3.9 mmol/L (ref 3.5–5.1)
Sodium: 136 mmol/L (ref 135–145)
Total Bilirubin: 0.9 mg/dL (ref 0.3–1.2)
Total Protein: 7.2 g/dL (ref 6.5–8.1)

## 2020-01-28 LAB — URINALYSIS, ROUTINE W REFLEX MICROSCOPIC
Bacteria, UA: NONE SEEN
Bilirubin Urine: NEGATIVE
Glucose, UA: NEGATIVE mg/dL
Hgb urine dipstick: NEGATIVE
Ketones, ur: NEGATIVE mg/dL
Nitrite: NEGATIVE
Protein, ur: NEGATIVE mg/dL
Specific Gravity, Urine: 1.02 (ref 1.005–1.030)
pH: 5 (ref 5.0–8.0)

## 2020-01-28 LAB — SURGICAL PCR SCREEN
MRSA, PCR: NEGATIVE
Staphylococcus aureus: NEGATIVE

## 2020-01-28 NOTE — Patient Instructions (Signed)
Your procedure is scheduled on: Tues 9/21 Report to Day Surgery. To find out your arrival time please call [phone] between 1PM - 3PM on Mon 9/20.  Remember: Instructions that are not followed completely may result in serious medical risk,  up to and including death, or upon the discretion of your surgeon and anesthesiologist your  surgery may need to be rescheduled.     _X__ 1. Do not eat food after midnight the night before your procedure.                 No chewing gum or hard candies. You may drink clear liquids up to 2 hours                 before you are scheduled to arrive for your surgery- DO not drink clear                 liquids within 2 hours of the start of your surgery.                 Clear Liquids include:  water, apple juice without pulp, clear Gatorade, G2 or                  Gatorade Zero (avoid Red/Purple/Blue), Black Coffee or Tea (Do not add                 anything to coffee or tea). __x___2.   Complete the "Ensure Clear Pre-surgery Clear Carbohydrate Drink" provided to you, 2 hours before arrival.   __X__2.  On the morning of surgery brush your teeth with toothpaste and water, you                may rinse your mouth with mouthwash if you wish.  Do not swallow any toothpaste of mouthwash.     __ 3.  No Alcohol for 24 hours before or after surgery.   ___ 4.  Do Not Smoke or use e-cigarettes For 24 Hours Prior to Your Surgery.                 Do not use any chewable tobacco products for at least 6 hours prior to                 Surgery.  ___  5.  Do not use any recreational drugs (marijuana, cocaine, heroin, ecstasy, MDMA or other)                For at least one week prior to your surgery.  Combination of these drugs with anesthesia                May have life threatening results.  ____  6.  Bring all medications with you on the day of surgery if instructed.   __x__  7.  Notify your doctor if there is any change in your medical  condition      (cold, fever, infections).     Do not wear jewelry, make-up, hairpins, clips or nail polish. Do not wear lotions, powders, or perfumes. You may wear deodorant. Do not shave 48 hours prior to surgery. Men may shave face and neck. Do not bring valuables to the hospital.    [facility] is not responsible for any belongings or valuables.  Contacts, dentures or bridgework may not be worn into surgery. Leave your suitcase in the car. After surgery it may be brought to your room. For patients admitted to the hospital, discharge time  is determined by your treatment team.   Patients discharged the day of surgery will not be allowed to drive home.   Make arrangements for someone to be with you for the first 24 hours of your Same Day Discharge.    Please read over the following fact sheets that you were given:   Incentive spirometer    __x__ Take these medicines the morning of surgery with A SIP OF WATER:    1. BETAINE PO  2.   3.   4.  5.  6.  ____ Fleet Enema (as directed)   _x___ Use CHG Soap (or wipes) as directed  ____ Use Benzoyl Peroxide Gel as instructed  ____ Use inhalers on the day of surgery  ____ Stop metformin 2 days prior to surgery    ____ Take 1/2 of usual insulin dose the night before surgery. No insulin the morning          of surgery.   ____ Stop Coumadin/Plavix/aspirin on   ____ Stop Anti-inflammatories on    _x___ Stop supplements until after surgery.  Misc Natural Products (OSTEO BI-FLEX ADV DOUBLE ST) TABS,Licorice, Glycyrrhiza glabra, (LICORICE PO) today  ____ Bring C-Pap to the hospital.    If you have any questions regarding your pre-procedure instructions,  Please call Pre-admit Testing at [facility]

## 2020-02-01 ENCOUNTER — Other Ambulatory Visit: Payer: Self-pay

## 2020-02-01 ENCOUNTER — Other Ambulatory Visit
Admission: RE | Admit: 2020-02-01 | Discharge: 2020-02-01 | Disposition: A | Discharge status: Home / Self Care | Payer: Medicare Other | Source: Ambulatory Visit | Attending: Orthopedic Surgery | Admitting: Orthopedic Surgery

## 2020-02-01 DIAGNOSIS — Z01812 Encounter for preprocedural laboratory examination: Secondary | ICD-10-CM | POA: Insufficient documentation

## 2020-02-01 DIAGNOSIS — Z20822 Contact with and (suspected) exposure to covid-19: Secondary | ICD-10-CM | POA: Diagnosis not present

## 2020-02-01 LAB — TYPE AND SCREEN
ABO/RH(D): O POS
Antibody Screen: NEGATIVE

## 2020-02-02 ENCOUNTER — Ambulatory Visit: Payer: Medicare Other | Admitting: Anesthesiology

## 2020-02-02 ENCOUNTER — Ambulatory Visit: Payer: Medicare Other | Admitting: Urgent Care

## 2020-02-02 ENCOUNTER — Ambulatory Visit: Payer: Medicare Other

## 2020-02-02 ENCOUNTER — Encounter
Admission: RE | Disposition: A | Discharge status: Home / Self Care | Payer: Self-pay | Source: Home / Self Care | Attending: Orthopedic Surgery

## 2020-02-02 ENCOUNTER — Observation Stay
Admission: RE | Admit: 2020-02-02 | Discharge: 2020-02-04 | Disposition: A | Discharge status: Home / Self Care | Payer: Medicare Other | Attending: Orthopedic Surgery | Admitting: Orthopedic Surgery

## 2020-02-02 ENCOUNTER — Encounter: Payer: Self-pay | Admitting: Orthopedic Surgery

## 2020-02-02 DIAGNOSIS — Z419 Encounter for procedure for purposes other than remedying health state, unspecified: Secondary | ICD-10-CM

## 2020-02-02 DIAGNOSIS — I340 Nonrheumatic mitral (valve) insufficiency: Secondary | ICD-10-CM | POA: Diagnosis not present

## 2020-02-02 DIAGNOSIS — K219 Gastro-esophageal reflux disease without esophagitis: Secondary | ICD-10-CM | POA: Diagnosis not present

## 2020-02-02 DIAGNOSIS — I129 Hypertensive chronic kidney disease with stage 1 through stage 4 chronic kidney disease, or unspecified chronic kidney disease: Secondary | ICD-10-CM | POA: Insufficient documentation

## 2020-02-02 DIAGNOSIS — G8918 Other acute postprocedural pain: Secondary | ICD-10-CM

## 2020-02-02 DIAGNOSIS — Z79899 Other long term (current) drug therapy: Secondary | ICD-10-CM | POA: Diagnosis not present

## 2020-02-02 DIAGNOSIS — M1611 Unilateral primary osteoarthritis, right hip: Principal | ICD-10-CM | POA: Insufficient documentation

## 2020-02-02 DIAGNOSIS — Z96649 Presence of unspecified artificial hip joint: Secondary | ICD-10-CM

## 2020-02-02 DIAGNOSIS — F329 Major depressive disorder, single episode, unspecified: Secondary | ICD-10-CM | POA: Insufficient documentation

## 2020-02-02 DIAGNOSIS — Z471 Aftercare following joint replacement surgery: Secondary | ICD-10-CM | POA: Diagnosis not present

## 2020-02-02 DIAGNOSIS — N183 Chronic kidney disease, stage 3 unspecified: Secondary | ICD-10-CM | POA: Diagnosis not present

## 2020-02-02 DIAGNOSIS — M25551 Pain in right hip: Secondary | ICD-10-CM | POA: Diagnosis present

## 2020-02-02 DIAGNOSIS — E785 Hyperlipidemia, unspecified: Secondary | ICD-10-CM | POA: Diagnosis not present

## 2020-02-02 DIAGNOSIS — Z96641 Presence of right artificial hip joint: Secondary | ICD-10-CM | POA: Diagnosis not present

## 2020-02-02 HISTORY — PX: TOTAL HIP ARTHROPLASTY: SHX124

## 2020-02-02 LAB — CBC
HCT: 32.9 % — ABNORMAL LOW (ref 36.0–46.0)
Hemoglobin: 11.1 g/dL — ABNORMAL LOW (ref 12.0–15.0)
MCH: 31.7 pg (ref 26.0–34.0)
MCHC: 33.7 g/dL (ref 30.0–36.0)
MCV: 94 fL (ref 80.0–100.0)
Platelets: 130 10*3/uL — ABNORMAL LOW (ref 150–400)
RBC: 3.5 MIL/uL — ABNORMAL LOW (ref 3.87–5.11)
RDW: 13.4 % (ref 11.5–15.5)
WBC: 8.6 10*3/uL (ref 4.0–10.5)
nRBC: 0 % (ref 0.0–0.2)

## 2020-02-02 LAB — COMPREHENSIVE METABOLIC PANEL
ALT: 16 U/L (ref 0–44)
AST: 31 U/L (ref 15–41)
Albumin: 3.4 g/dL — ABNORMAL LOW (ref 3.5–5.0)
Alkaline Phosphatase: 79 U/L (ref 38–126)
Anion gap: 10 (ref 5–15)
BUN: 13 mg/dL (ref 8–23)
CO2: 24 mmol/L (ref 22–32)
Calcium: 8.5 mg/dL — ABNORMAL LOW (ref 8.9–10.3)
Chloride: 100 mmol/L (ref 98–111)
Creatinine, Ser: 0.92 mg/dL (ref 0.44–1.00)
GFR calc Af Amer: >60 mL/min (ref >60)
GFR calc non Af Amer: >60 mL/min (ref >60)
Glucose, Bld: 192 mg/dL — ABNORMAL HIGH (ref 70–99)
Potassium: 3.5 mmol/L (ref 3.5–5.1)
Sodium: 134 mmol/L — ABNORMAL LOW (ref 135–145)
Total Bilirubin: 0.6 mg/dL (ref 0.3–1.2)
Total Protein: 5.4 g/dL — ABNORMAL LOW (ref 6.5–8.1)

## 2020-02-02 LAB — SARS CORONAVIRUS 2 (TAT 6-24 HRS): SARS Coronavirus 2: NEGATIVE

## 2020-02-02 LAB — ABO/RH: ABO/RH(D): O POS

## 2020-02-02 SURGERY — ARTHROPLASTY, HIP, TOTAL, ANTERIOR APPROACH
Anesthesia: Spinal | Site: Hip | Laterality: Right

## 2020-02-02 MED ORDER — CEFAZOLIN SODIUM-DEXTROSE 1-4 GM/50ML-% IV SOLN
1.0000 g | Freq: Four times a day (QID) | INTRAVENOUS | Status: AC
  Administered 2020-02-02 (×2): 1 g via INTRAVENOUS
  Filled 2020-02-02: qty 50

## 2020-02-02 MED ORDER — BUPIVACAINE-EPINEPHRINE (PF) 0.25% -1:200000 IJ SOLN
INTRAMUSCULAR | Status: AC
  Filled 2020-02-02: qty 30

## 2020-02-02 MED ORDER — MIDAZOLAM HCL 2 MG/2ML IJ SOLN
INTRAMUSCULAR | Status: AC
  Filled 2020-02-02: qty 2

## 2020-02-02 MED ORDER — CEFAZOLIN SODIUM-DEXTROSE 2-4 GM/100ML-% IV SOLN
2.0000 g | INTRAVENOUS | Status: AC
  Administered 2020-02-02: 2 g via INTRAVENOUS

## 2020-02-02 MED ORDER — PROPOFOL 500 MG/50ML IV EMUL
INTRAVENOUS | Status: AC
  Filled 2020-02-02: qty 50

## 2020-02-02 MED ORDER — OXYCODONE HCL 5 MG/5ML PO SOLN: 5.0000 mg | Freq: Once | ORAL | Status: AC | PRN

## 2020-02-02 MED ORDER — TRAMADOL HCL 50 MG PO TABS
ORAL_TABLET | ORAL | Status: AC
  Filled 2020-02-02: qty 1

## 2020-02-02 MED ORDER — MENTHOL 3 MG MT LOZG
1.0000 | LOZENGE | OROMUCOSAL | Status: DC | PRN
  Filled 2020-02-02: qty 9

## 2020-02-02 MED ORDER — TRANEXAMIC ACID 1000 MG/10ML IV SOLN
INTRAVENOUS | Status: AC
  Filled 2020-02-02: qty 10

## 2020-02-02 MED ORDER — ENOXAPARIN SODIUM 40 MG/0.4ML ~~LOC~~ SOLN: 40.0000 mg | SUBCUTANEOUS | 0 refills | Status: AC

## 2020-02-02 MED ORDER — ENOXAPARIN SODIUM 40 MG/0.4ML ~~LOC~~ SOLN
40.0000 mg | SUBCUTANEOUS | Status: DC
  Administered 2020-02-03 – 2020-02-04 (×2): 40 mg via SUBCUTANEOUS
  Filled 2020-02-02 (×2): qty 0.4

## 2020-02-02 MED ORDER — SODIUM CHLORIDE FLUSH 0.9 % IV SOLN
INTRAVENOUS | Status: AC
  Filled 2020-02-02: qty 40

## 2020-02-02 MED ORDER — CEFAZOLIN SODIUM-DEXTROSE 2-4 GM/100ML-% IV SOLN
INTRAVENOUS | Status: AC
  Filled 2020-02-02: qty 100

## 2020-02-02 MED ORDER — MIDAZOLAM HCL 5 MG/5ML IJ SOLN
INTRAMUSCULAR | Status: DC | PRN
  Administered 2020-02-02 (×2): 1 mg via INTRAVENOUS

## 2020-02-02 MED ORDER — BUPIVACAINE LIPOSOME 1.3 % IJ SUSP
INTRAMUSCULAR | Status: AC
  Filled 2020-02-02: qty 20

## 2020-02-02 MED ORDER — CHLORHEXIDINE GLUCONATE 0.12 % MT SOLN: 15.0000 mL | Freq: Once | OROMUCOSAL | Status: AC

## 2020-02-02 MED ORDER — METOCLOPRAMIDE HCL 10 MG PO TABS: 5.0000 mg | ORAL_TABLET | Freq: Three times a day (TID) | ORAL | Status: DC | PRN

## 2020-02-02 MED ORDER — BUPIVACAINE-EPINEPHRINE 0.25% -1:200000 IJ SOLN
INTRAMUSCULAR | Status: DC | PRN
  Administered 2020-02-02: 30 mL

## 2020-02-02 MED ORDER — SODIUM CHLORIDE 0.9 % IV SOLN
INTRAVENOUS | Status: DC | PRN
  Administered 2020-02-02: 60 mL

## 2020-02-02 MED ORDER — METHOCARBAMOL 500 MG PO TABS: 500.0000 mg | ORAL_TABLET | Freq: Four times a day (QID) | ORAL | Status: DC | PRN

## 2020-02-02 MED ORDER — METHOCARBAMOL 1000 MG/10ML IJ SOLN
500.0000 mg | Freq: Four times a day (QID) | INTRAVENOUS | Status: DC | PRN
  Filled 2020-02-02: qty 5

## 2020-02-02 MED ORDER — NEOMYCIN-POLYMYXIN B GU 40-200000 IR SOLN
Status: AC
  Filled 2020-02-02: qty 20

## 2020-02-02 MED ORDER — HYDROCODONE-ACETAMINOPHEN 7.5-325 MG PO TABS
1.0000 | ORAL_TABLET | ORAL | Status: DC | PRN
  Administered 2020-02-03: 1 via ORAL
  Administered 2020-02-04 (×2): 2 via ORAL
  Filled 2020-02-02: qty 1
  Filled 2020-02-02 (×2): qty 2

## 2020-02-02 MED ORDER — PROPOFOL 10 MG/ML IV BOLUS
INTRAVENOUS | Status: DC | PRN
  Administered 2020-02-02: 100 ug/kg/min via INTRAVENOUS

## 2020-02-02 MED ORDER — EPHEDRINE SULFATE 50 MG/ML IJ SOLN
INTRAMUSCULAR | Status: DC | PRN
  Administered 2020-02-02: 15 mg via INTRAVENOUS

## 2020-02-02 MED ORDER — PHENYLEPHRINE HCL (PRESSORS) 10 MG/ML IV SOLN
INTRAVENOUS | Status: AC
  Filled 2020-02-02: qty 1

## 2020-02-02 MED ORDER — ACETAMINOPHEN 325 MG PO TABS
325.0000 mg | ORAL_TABLET | Freq: Four times a day (QID) | ORAL | Status: DC | PRN
  Administered 2020-02-03 – 2020-02-04 (×3): 650 mg via ORAL
  Filled 2020-02-02 (×3): qty 2

## 2020-02-02 MED ORDER — OXYCODONE HCL 5 MG PO TABS: 5.0000 mg | ORAL_TABLET | Freq: Once | ORAL | Status: AC | PRN

## 2020-02-02 MED ORDER — TRAMADOL HCL 50 MG PO TABS
50.0000 mg | ORAL_TABLET | Freq: Four times a day (QID) | ORAL | Status: DC
  Administered 2020-02-02 – 2020-02-03 (×2): 50 mg via ORAL
  Filled 2020-02-02 (×3): qty 1

## 2020-02-02 MED ORDER — SODIUM CHLORIDE 0.9 % IV SOLN: INTRAVENOUS | Status: DC

## 2020-02-02 MED ORDER — HYDROCODONE-ACETAMINOPHEN 5-325 MG PO TABS
1.0000 | ORAL_TABLET | ORAL | Status: DC | PRN
  Administered 2020-02-02 – 2020-02-03 (×2): 1 via ORAL
  Filled 2020-02-02 (×2): qty 1

## 2020-02-02 MED ORDER — OXYCODONE HCL 5 MG PO TABS
ORAL_TABLET | ORAL | Status: AC
  Administered 2020-02-02: 5 mg via ORAL
  Filled 2020-02-02: qty 1

## 2020-02-02 MED ORDER — LACTATED RINGERS IV BOLUS
250.0000 mL | Freq: Once | INTRAVENOUS | Status: AC
  Administered 2020-02-02: 250 mL via INTRAVENOUS

## 2020-02-02 MED ORDER — FENTANYL CITRATE (PF) 100 MCG/2ML IJ SOLN: 25.0000 ug | INTRAMUSCULAR | Status: DC | PRN

## 2020-02-02 MED ORDER — CHLORHEXIDINE GLUCONATE 0.12 % MT SOLN
OROMUCOSAL | Status: AC
  Administered 2020-02-02: 15 mL via OROMUCOSAL
  Filled 2020-02-02: qty 15

## 2020-02-02 MED ORDER — ONDANSETRON HCL 4 MG/2ML IJ SOLN: 4.0000 mg | Freq: Four times a day (QID) | INTRAMUSCULAR | Status: DC | PRN

## 2020-02-02 MED ORDER — MORPHINE SULFATE (PF) 2 MG/ML IV SOLN: 0.5000 mg | INTRAVENOUS | Status: DC | PRN

## 2020-02-02 MED ORDER — HYDROCODONE-ACETAMINOPHEN 5-325 MG PO TABS: 1.0000 | ORAL_TABLET | ORAL | 0 refills | Status: AC | PRN

## 2020-02-02 MED ORDER — LACTATED RINGERS IV SOLN: INTRAVENOUS | Status: DC

## 2020-02-02 MED ORDER — PHENOL 1.4 % MT LIQD
1.0000 | OROMUCOSAL | Status: DC | PRN
  Filled 2020-02-02: qty 177

## 2020-02-02 MED ORDER — ORAL CARE MOUTH RINSE: 15.0000 mL | Freq: Once | OROMUCOSAL | Status: AC

## 2020-02-02 MED ORDER — CEFAZOLIN SODIUM-DEXTROSE 1-4 GM/50ML-% IV SOLN
INTRAVENOUS | Status: AC
  Filled 2020-02-02: qty 50

## 2020-02-02 MED ORDER — LACTATED RINGERS IV BOLUS
500.0000 mL | Freq: Once | INTRAVENOUS | Status: AC
  Administered 2020-02-02: 500 mL via INTRAVENOUS

## 2020-02-02 MED ORDER — DOCUSATE SODIUM 100 MG PO CAPS
100.0000 mg | ORAL_CAPSULE | Freq: Two times a day (BID) | ORAL | Status: DC
  Administered 2020-02-02 – 2020-02-04 (×4): 100 mg via ORAL
  Filled 2020-02-02 (×4): qty 1

## 2020-02-02 MED ORDER — TRANEXAMIC ACID-NACL 1000-0.7 MG/100ML-% IV SOLN
INTRAVENOUS | Status: DC | PRN
  Administered 2020-02-02: 1000 mg via INTRAVENOUS

## 2020-02-02 MED ORDER — METOCLOPRAMIDE HCL 5 MG/ML IJ SOLN: 5.0000 mg | Freq: Three times a day (TID) | INTRAMUSCULAR | Status: DC | PRN

## 2020-02-02 MED ORDER — ONDANSETRON HCL 4 MG PO TABS: 4.0000 mg | ORAL_TABLET | Freq: Four times a day (QID) | ORAL | Status: DC | PRN

## 2020-02-02 SURGICAL SUPPLY — 63 items
APL PRP STRL LF DISP 70% ISPRP (MISCELLANEOUS) ×1
BLADE SAGITTAL AGGR TOOTH XLG (BLADE) ×3 IMPLANT
BNDG COHESIVE 6X5 TAN STRL LF (GAUZE/BANDAGES/DRESSINGS) ×9 IMPLANT
CANISTER SUCT 1200ML W/VALVE (MISCELLANEOUS) ×3 IMPLANT
CANISTER WOUND CARE 500ML ATS (WOUND CARE) ×3 IMPLANT
CHLORAPREP W/TINT 26 (MISCELLANEOUS) ×3 IMPLANT
COVER BACK TABLE REUSABLE LG (DRAPES) ×3 IMPLANT
COVER WAND RF STERILE (DRAPES) ×3 IMPLANT
DRAPE 3/4 80X56 (DRAPES) ×9 IMPLANT
DRAPE C-ARM XRAY 36X54 (DRAPES) ×3 IMPLANT
DRAPE INCISE IOBAN 66X60 STRL (DRAPES) IMPLANT
DRAPE POUCH INSTRU U-SHP 10X18 (DRAPES) ×3 IMPLANT
DRESSING SURGICEL FIBRLLR 1X2 (HEMOSTASIS) ×2 IMPLANT
DRSG MEPILEX SACRM 8.7X9.8 (GAUZE/BANDAGES/DRESSINGS) ×3 IMPLANT
DRSG OPSITE POSTOP 4X8 (GAUZE/BANDAGES/DRESSINGS) ×6 IMPLANT
DRSG SURGICEL FIBRILLAR 1X2 (HEMOSTASIS) ×6
ELECT BLADE 6.5 EXT (BLADE) ×3 IMPLANT
ELECT REM PT RETURN 9FT ADLT (ELECTROSURGICAL) ×3
ELECTRODE REM PT RTRN 9FT ADLT (ELECTROSURGICAL) ×1 IMPLANT
GLOVE BIOGEL PI IND STRL 9 (GLOVE) ×1 IMPLANT
GLOVE BIOGEL PI INDICATOR 9 (GLOVE) ×2
GLOVE SURG SYN 9.0  PF PI (GLOVE) ×6
GLOVE SURG SYN 9.0 PF PI (GLOVE) ×2 IMPLANT
GOWN SRG 2XL LVL 4 RGLN SLV (GOWNS) ×1 IMPLANT
GOWN STRL NON-REIN 2XL LVL4 (GOWNS) ×3
GOWN STRL REUS W/ TWL LRG LVL3 (GOWN DISPOSABLE) ×1 IMPLANT
GOWN STRL REUS W/TWL LRG LVL3 (GOWN DISPOSABLE) ×3
HEMOVAC 400CC 10FR (MISCELLANEOUS) IMPLANT
HIP FEM HD S 28 (Head) ×2 IMPLANT
HOLDER FOLEY CATH W/STRAP (MISCELLANEOUS) ×3 IMPLANT
HOOD PEEL AWAY FLYTE STAYCOOL (MISCELLANEOUS) ×3 IMPLANT
IRRIGATION SURGIPHOR STRL (IV SOLUTION) IMPLANT
KIT PREVENA INCISION MGT 13 (CANNISTER) ×3 IMPLANT
LINER DM 28MM (Liner) ×2 IMPLANT
MAT ABSORB  FLUID 56X50 GRAY (MISCELLANEOUS) ×3
MAT ABSORB FLUID 56X50 GRAY (MISCELLANEOUS) ×1 IMPLANT
NDL SAFETY ECLIPSE 18X1.5 (NEEDLE) ×1 IMPLANT
NDL SPNL 20GX3.5 QUINCKE YW (NEEDLE) ×2 IMPLANT
NEEDLE HYPO 18GX1.5 SHARP (NEEDLE) ×3
NEEDLE SPNL 20GX3.5 QUINCKE YW (NEEDLE) ×6 IMPLANT
NS IRRIG 1000ML POUR BTL (IV SOLUTION) ×3 IMPLANT
PACK HIP COMPR (MISCELLANEOUS) ×3 IMPLANT
SCALPEL PROTECTED #10 DISP (BLADE) ×6 IMPLANT
SHELL ACETABULAR DM  48MM (Shell) ×2 IMPLANT
SOL PREP PVP 2OZ (MISCELLANEOUS) ×3
SOLUTION PREP PVP 2OZ (MISCELLANEOUS) ×1 IMPLANT
SPONGE DRAIN TRACH 4X4 STRL 2S (GAUZE/BANDAGES/DRESSINGS) ×3 IMPLANT
STAPLER SKIN PROX 35W (STAPLE) ×3 IMPLANT
STEM FEMORAL 1 STD COLLARED (Stem) ×2 IMPLANT
STRAP SAFETY 5IN WIDE (MISCELLANEOUS) ×3 IMPLANT
SUT DVC 2 QUILL PDO  T11 36X36 (SUTURE) ×3
SUT DVC 2 QUILL PDO T11 36X36 (SUTURE) ×1 IMPLANT
SUT SILK 0 (SUTURE) ×3
SUT SILK 0 30XBRD TIE 6 (SUTURE) ×1 IMPLANT
SUT V-LOC 90 ABS DVC 3-0 CL (SUTURE) ×3 IMPLANT
SUT VIC AB 1 CT1 36 (SUTURE) ×3 IMPLANT
SYR 20ML LL LF (SYRINGE) ×3 IMPLANT
SYR 30ML LL (SYRINGE) ×3 IMPLANT
SYR 50ML LL SCALE MARK (SYRINGE) ×6 IMPLANT
SYR BULB IRRIG 60ML STRL (SYRINGE) ×3 IMPLANT
TAPE MICROFOAM 4IN (TAPE) ×3 IMPLANT
TOWEL OR 17X26 4PK STRL BLUE (TOWEL DISPOSABLE) ×3 IMPLANT
TRAY FOLEY MTR SLVR 16FR STAT (SET/KITS/TRAYS/PACK) ×3 IMPLANT

## 2020-02-02 NOTE — Progress Notes (Signed)
Unable to perform PT, some persistent numbness to right lower leg, possibly related to exparel injection.  Will need to keep overnight and hope she can ambulate and go home tomorrow am.

## 2020-02-02 NOTE — Anesthesia Procedure Notes (Signed)
Spinal  Patient location during procedure: OR Preanesthetic Checklist Completed: patient identified, IV checked, site marked, risks and benefits discussed, surgical consent, monitors and equipment checked, pre-op evaluation and timeout performed Spinal Block Patient position: sitting Prep: DuraPrep Patient monitoring: heart rate, cardiac monitor, continuous pulse ox and blood pressure Approach: midline Location: L3-4 Injection technique: single-shot Needle Needle type: Sprotte  Needle gauge: 24 G Needle length: 9 cm Assessment Sensory level: T4

## 2020-02-02 NOTE — Progress Notes (Signed)
PT Cancellation Note  Patient Details Name: Katelyn Knight MRN: [id number] DOB: 06-22-1945   Cancelled Treatment:    Reason Eval/Treat Not Completed: Other (comment) PT order received and chart reviewed. Pt currently with no RLE distal sensation or active motor control. Pt does have RLE proximal active movement and can perform quad set however unable to complete LAQ. Pt able to perform sit <> stand with max+2 A however unable to unweight RLE to take steps. Updated nurse who called MD. Will attempt another date when sensation and motor control has returned for safe mobility.   [place] 02/02/2020, 4:06 PM

## 2020-02-02 NOTE — Op Note (Signed)
02/02/2020  9:00 AM  PATIENT:  Katelyn Knight  74 y.o. female  PRE-OPERATIVE DIAGNOSIS:  Primary osteoarthritis of right hip M16.11  POST-OPERATIVE DIAGNOSIS:  Primary osteoarthritis of right hip M16.11  PROCEDURE:  Procedure(s): TOTAL HIP ARTHROPLASTY ANTERIOR APPROACH (Right)  SURGEON: Laurene Footman, MD  ASSISTANTS: None  ANESTHESIA:   spinal  EBL:  Total I/O In: 100 [IV Piggyback:100] Out: 300 [Urine:100; Blood:200]  BLOOD ADMINISTERED:none  DRAINS: Incisional wound VAC   LOCAL MEDICATIONS USED:  MARCAINE    and OTHER Exparel  SPECIMEN:  Source of Specimen:  Right femoral head  DISPOSITION OF SPECIMEN:  PATHOLOGY  COUNTS:  YES  TOURNIQUET:  * No tourniquets in log *  IMPLANTS: Medacta AMIS 1 standard stem, metal S 28 mm head, 48 mm mPACT DM cup and liner  DICTATION: .Dragon Dictation   The patient was brought to the operating room and after spinal anesthesia was obtained patient was placed on the operative table with the ipsilateral foot into the Medacta attachment, contralateral leg on a well-padded table. C-arm was brought in and preop template x-ray taken. After prepping and draping in usual sterile fashion appropriate patient identification and timeout procedures were completed. Anterior approach to the hip was obtained and centered over the greater trochanter and TFL muscle. The subcutaneous tissue was incised hemostasis being achieved by electrocautery. TFL fascia was incised and the muscle retracted laterally deep retractor placed. The lateral femoral circumflex vessels were identified and ligated. The anterior capsule was exposed and a capsulotomy performed. The neck was identified and a femoral neck cut carried out with a saw. The head was removed without difficulty and showed sclerotic femoral head and acetabulum. Reaming was carried out to 46 mm and a 48 mm cup trial gave appropriate tightness to the acetabular component a 48 DM cup was impacted into position.  The leg was then externally rotated and ischiofemoral and pubofemoral releases carried out. The femur was sequentially broached to a size 1, size 1 standard with S head trials were placed and the final components chosen. The 1 standard stem was inserted along with a metal S 28 mm head and 48 mm liner. The hip was reduced and was stable the wound was thoroughly irrigated with fibrillar placed along the posterior capsule and medial neck. The deep fascia ws closed using a heavy Quill after infiltration of 30 cc of quarter percent Sensorcaine with epinephrine mixed with Exparel throughout the case .3-0 V-loc to close the skin with skin staples.  Incisional wound VAC applied and patient was sent to recovery in stable condition.   PLAN OF CARE: Discharge to home after PACU

## 2020-02-02 NOTE — H&P (Signed)
Chief Complaint  Patient presents with  . Right Hip - Pain   Katelyn Knight is a 74 y.o. female who presents today for evaluation of right hip pain. She has had years of increasing right hip pain. No trauma or injury. No burning numbness or tingling. Pain is mostly in the groin but also complains of right buttocks pain, she describes a deep aching pain in both the groin and buttocks. She notes pain at nighttime as well as during the day with activity. Hip flexion seems to exacerbate her groin pain the most. She takes occasional over-the-counter anti-inflammatory medications with no improvement. Pain can be moderate to severe. Pain is currently moderate this morning, 7 out of 10. No back pain numbness tingling radicular symptoms. Her pain is interfering with quality of life and activities of daily living. She enjoys gardening and has a hard time leaning forward, flexing at the hip due to right hip pain. Patient has had diagnostic right hip joint injection which gave her complete relief of her right hip pain for 2 weeks, then pain returned.  Past Medical History: Past Medical History:  Diagnosis Date  . Breast cancer (CMS-HCC)  . High blood pressure   Past Surgical History: Past Surgical History:  Procedure Laterality Date  . APPENDECTOMY  . EGD 07/24/2018  Dr. Kennis Carina @ [place] - no rpt per RTE  . HYSTERECTOMY SUPRACERVICAL ABDOMINAL W/REMOVAL TUBES &/OR OVARIES  . MASTECTOMY PARTIAL / LUMPECTOMY Right   Past Family History: Family History  Problem Relation Age of Onset  . Myocardial Infarction (Heart attack) Father   Medications: Current Outpatient Medications Ordered in Epic  Medication Sig Dispense Refill  . escitalopram oxalate (LEXAPRO) 10 MG tablet  . losartan-hydrochlorothiazide (HYZAAR) 100-12.5 mg tablet   No current Epic-ordered facility-administered medications on file.   Allergies: Allergies  Allergen Reactions  . Crestor [Rosuvastatin] Other (See Comments)   Severe acid reflux    Review of Systems:  A comprehensive 14 point ROS was performed, reviewed by me today, and the pertinent orthopaedic findings are documented in the HPI.  Exam: BP 128/78  Ht 165.1 cm (5\' 5" )  Wt 68 kg (150 lb)  BMI 24.96 kg/m  General:  Well developed, well nourished, no apparent distress, normal affect, normal gait with no antalgic component.   HEENT: Head normocephalic, atraumatic, PERRL.   Abdomen: Soft, non tender, non distended, Bowel sounds present.  Heart: Examination of the heart reveals regular, rate, and rhythm. There is no murmur noted on ascultation. There is a normal apical pulse.  Lungs: Lungs are clear to auscultation. There is no wheeze, rhonchi, or crackles. There is normal expansion of bilateral chest walls.   Lumbar Spine: Examination of the lumbar spine reveals no bony abnormality, no edema, and no ecchymosis. There is no step off. The patient has full range of motion of the lumbar spine with flexion and extension. The patient has normal lateral bend and rotation. The patient has no pain with range of motion activities. The patient is non tender along the spinous process. The patient is non tender along the paravertebral muscles, with no muscle spasms. The patient is non tender along the iliac crest. The patient is non tender in the sciatic notch. The patient is non tender along the Sacroiliac joint. There is no Coccyx joint tenderness.   Right lower Extremities: Examination of the right lower extremities reveals no bony abnormality, no edema, and no ecchymosis. The patient has slightly limited right hip internal rotation. The patient has  no anterior hip joint tenderness. The patient has a negative axial load test at the hip joint. The patient has no tenderness going from hip flexion into extension. Moderate right groin and knee discomfort with right hip internal rotation. The patient is non tender along the greater trochanter region. The  patient has a negative Bevelyn Buckles' test bilaterally. There is normal skin warmth. There is normal capillary refill bilaterally.   Neurologic: The patient has a negative straight leg raise. The patient has normal muscle strength testing for the quadriceps, calves, ankle dorsiflexion, ankle plantarflexion, and extensor hallicus longus. The patient has sensation that is intact to light touch. The deep tendon reflexes are normal at the patella and achilles. No clonus is noted.   AP pelvis and lateral view of the right hip reviewed by me in the office today. Impression: Patient has advanced right hip osteoarthritis with significant superior acetabular spurring with subchondral sclerosis. There is mild central joint space narrowing with subchondral cyst formation along the inferior aspect of the hip joint. There is no evidence of acute bony abnormality. Pelvic rings are intact. No abnormal bony lesions. 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  Impression: Primary osteoarthritis of right hip [M16.11] Primary osteoarthritis of right hip (primary encounter diagnosis)  Plan:  58. 74 year old female with years of progressive right hip pain. Pain is moderate to severe and interfering with quality of life and activities of daily living. Risks, benefits, complications of a right total hip arthroplasty have been discussed with the patient. Patient has agreed and consented to the procedure. She understands that at this time we are currently performing outpatient total hips and unable to keep the patient in the hospital. Patient prefers going home and states she has help at home. We discussed outpatient total hip arthroplasty, patient would like to proceed with this as soon as possible.  This note was generated in part with voice recognition software and I apologize for any typographical errors that were not detected and corrected.  Feliberto Gottron MPA-C    Electronically signed by  Feliberto Gottron, PA at 01/13/2020 9:50 AM EDT  Back to top of Progress Notes Dalia Heading, CMA - 01/13/2020 8:15 AM EDT Formatting of this note might be different from the original. Review of Systems  Constitutional: Negative.  HENT: Negative.  Eyes: Negative.  Respiratory: Negative.  Cardiovascular: Negative.  Gastrointestinal: Negative.  Endocrine: Negative.  Genitourinary: Negative.  Musculoskeletal: Positive for arthralgias and gait problem.  Skin: Negative.  Allergic/Immunologic: Negative.  Hematological: Negative.  Psychiatric/Behavioral: Negative.    Electronically signed by Dalia Heading, CMA at 01/13/2020 9:14 AM EDT   Reviewed paper H+Pt. No changes noted.

## 2020-02-02 NOTE — Progress Notes (Addendum)
CBC and creatinine drawn in post op as ordered. Notified Dr. Rudene Christians of values. MD states he will look at results and advise.   Per Dr. Rudene Christians, labs redrawn and reviewed by MD.

## 2020-02-02 NOTE — Anesthesia Postprocedure Evaluation (Signed)
Anesthesia Post Note  Patient: Katelyn Knight  Procedure(s) Performed: TOTAL HIP ARTHROPLASTY ANTERIOR APPROACH (Right Hip)  Patient location during evaluation: Phase II Anesthesia Type: Spinal Level of consciousness: oriented and awake and alert Pain management: pain level controlled Vital Signs Assessment: post-procedure vital signs reviewed and stable Respiratory status: spontaneous breathing Cardiovascular status: blood pressure returned to baseline and stable Postop Assessment: no headache, no backache, no apparent nausea or vomiting and patient able to bend at knees Anesthetic complications: no   No complications documented.   Last Vitals:  Vitals:   02/02/20 1105 02/02/20 1238  BP: 136/62 127/67  Pulse: 67 73  Resp: 16 16  Temp: (!) 36.3 C   SpO2: 98% 99%    Last Pain:  Vitals:   02/02/20 1436  TempSrc:   PainSc: 8                  Precious Haws Fianna Snowball

## 2020-02-02 NOTE — Anesthesia Preprocedure Evaluation (Signed)
Anesthesia Evaluation  Patient identified by MRN, date of birth, ID band Patient awake    Reviewed: Allergy & Precautions, H&P , NPO status , Patient's Chart, lab work & pertinent test results  Airway Mallampati: III  TM Distance: <3 FB Neck ROM: limited    Dental  (+) Chipped   Pulmonary neg pulmonary ROS, neg shortness of breath,    Pulmonary exam normal        Cardiovascular Exercise Tolerance: Good hypertension, (-) angina(-) DOE Normal cardiovascular exam     Neuro/Psych PSYCHIATRIC DISORDERS negative neurological ROS     GI/Hepatic Neg liver ROS, GERD  Medicated and Controlled,  Endo/Other  negative endocrine ROS  Renal/GU Renal disease     Musculoskeletal  (+) Arthritis ,   Abdominal   Peds  Hematology negative hematology ROS (+)   Anesthesia Other Findings Past Medical History: No date: Arthritis     Comment:  all over, takes osteobiflex 2007: Breast cancer ([place])     Comment:  Right breast No date: Depression No date: GERD (gastroesophageal reflux disease) No date: Heart murmur     Comment:  diagnosed in 40"s, no issues, mitral valve prolapse No date: HTN (hypertension)     Comment:  controlled on meds No date: Hyperlipidemia No date: Menopausal symptom No date: Osteoporosis 2007: Personal history of radiation therapy     Comment:  Right - mammocyte 2014: Shingles  Past Surgical History: No date: ABDOMINAL HYSTERECTOMY No date: ABDOMINAL HYSTERECTOMY No date: APPENDECTOMY     Comment:  At 74 years old 2007: BREAST BIOPSY; Right     Comment:  Positive 01/2006: BREAST LUMPECTOMY     Comment:  Dx Breast Cancer No date: BUNIONECTOMY; Left 03/31/2019: CATARACT EXTRACTION W/PHACO; Right     Comment:  Procedure: CATARACT EXTRACTION PHACO AND INTRAOCULAR               LENS PLACEMENT ([place]) RIGHT;  Surgeon: Birder Robson,               MD;  Location: [facility];  Service:                Ophthalmology;  Laterality: Right;  CDE 3.84 [place] 0:30.7 04/21/2019: CATARACT EXTRACTION W/PHACO; Left     Comment:  Procedure: CATARACT EXTRACTION PHACO AND INTRAOCULAR               LENS PLACEMENT ([place]) LEFT;  Surgeon: Birder Robson,               MD;  Location: [facility];  Service:               Ophthalmology;  Laterality: Left;  3.62   00:28.9   48 No date: CHOLECYSTECTOMY  BMI    Body Mass Index: 24.95 kg/m      Reproductive/Obstetrics negative OB ROS                             Anesthesia Physical Anesthesia Plan  ASA: III  Anesthesia Plan: Spinal   Post-op Pain Management:    Induction:   PONV Risk Score and Plan:   Airway Management Planned: Natural Airway and Nasal Cannula  Additional Equipment:   Intra-op Plan:   Post-operative Plan:   Informed Consent: I have reviewed the patients History and Physical, chart, labs and discussed the procedure including the risks, benefits and alternatives for the proposed anesthesia with the patient or authorized representative who has indicated  his/her understanding and acceptance.     Dental Advisory Given  Plan Discussed with: Anesthesiologist, CRNA and Surgeon  Anesthesia Plan Comments: (Patient reports no bleeding problems and no anticoagulant use.  Plan for spinal with backup GA  Patient consented for risks of anesthesia including but not limited to:  - adverse reactions to medications - damage to eyes, teeth, lips or other oral mucosa - nerve damage due to positioning  - risk of bleeding, infection and or nerve damage from spinal that could lead to paralysis - risk of headache or failed spinal - damage to teeth, lips or other oral mucosa - sore throat or hoarseness - damage to heart, brain, nerves, lungs, other parts of body or loss of life  Patient voiced understanding.)        Anesthesia Quick Evaluation

## 2020-02-02 NOTE — Transfer of Care (Signed)
Immediate Anesthesia Transfer of Care Note  Patient: Katelyn Knight  Procedure(s) Performed: TOTAL HIP ARTHROPLASTY ANTERIOR APPROACH (Right Hip)  Patient Location: PACU  Anesthesia Type:Spinal  Level of Consciousness: awake and alert   Airway & Oxygen Therapy: Patient Spontanous Breathing and Patient connected to nasal cannula oxygen  Post-op Assessment: Report given to RN and Post -op Vital signs reviewed and stable  Post vital signs: Reviewed and stable  Last Vitals:  Vitals Value Taken Time  BP 99/55 02/02/20 0902  Temp    Pulse 67 02/02/20 0905  Resp 17 02/02/20 0905  SpO2 96 % 02/02/20 0905  Vitals shown include unvalidated device data.  Last Pain:  Vitals:   02/02/20 0633  TempSrc: Tympanic  PainSc: 0-No pain         Complications: No complications documented.

## 2020-02-03 ENCOUNTER — Encounter: Payer: Self-pay | Admitting: Orthopedic Surgery

## 2020-02-03 DIAGNOSIS — Z79899 Other long term (current) drug therapy: Secondary | ICD-10-CM | POA: Diagnosis not present

## 2020-02-03 DIAGNOSIS — I340 Nonrheumatic mitral (valve) insufficiency: Secondary | ICD-10-CM | POA: Diagnosis not present

## 2020-02-03 DIAGNOSIS — N183 Chronic kidney disease, stage 3 unspecified: Secondary | ICD-10-CM | POA: Diagnosis not present

## 2020-02-03 DIAGNOSIS — M1611 Unilateral primary osteoarthritis, right hip: Secondary | ICD-10-CM | POA: Diagnosis not present

## 2020-02-03 DIAGNOSIS — K219 Gastro-esophageal reflux disease without esophagitis: Secondary | ICD-10-CM | POA: Diagnosis not present

## 2020-02-03 DIAGNOSIS — I129 Hypertensive chronic kidney disease with stage 1 through stage 4 chronic kidney disease, or unspecified chronic kidney disease: Secondary | ICD-10-CM | POA: Diagnosis not present

## 2020-02-03 NOTE — Progress Notes (Signed)
   Subjective: 1 Day Post-Op Procedure(s) (LRB): TOTAL HIP ARTHROPLASTY ANTERIOR APPROACH (Right) Patient reports pain as moderate.   Patient is well, and has had no acute complaints or problems Denies any CP, SOB, ABD pain. We will continue therapy today.  Plan is to go Home after hospital stay.  Objective: Vital signs in last 24 hours: Temp:  [97 F (36.1 C)-98.7 F (37.1 C)] 98.4 F (36.9 C) (09/22 0716) Pulse Rate:  [58-78] 63 (09/22 0716) Resp:  [11-20] 17 (09/22 0716) BP: (95-138)/(50-80) 123/58 (09/22 0716) SpO2:  [92 %-100 %] 97 % (09/22 0716)  Intake/Output from previous day: 09/21 0701 - 09/22 0700 In: 6168.4 [P.O.:245; I.V.:2465.8; IV Piggyback:3457.6] Out: 750 [Urine:550; Blood:200] Intake/Output this shift: No intake/output data recorded.  Recent Labs    02/02/20 1108 02/02/20 1305  HGB 5.6* 11.1*   Recent Labs    02/02/20 1108 02/02/20 1305  WBC 3.1* 8.6  RBC 1.74* 3.50*  HCT 17.3* 32.9*  PLT 66* 130*   Recent Labs    02/02/20 1108 02/02/20 1305  NA  --  134*  K  --  3.5  CL  --  100  CO2  --  24  BUN  --  13  CREATININE <0.30* 0.92  GLUCOSE  --  192*  CALCIUM  --  8.5*   No results for input(s): LABPT, INR in the last 72 hours.  EXAM General - Patient is Alert, Appropriate and Oriented Extremity - Neurovascular intact Sensation intact distally Intact pulses distally Dorsiflexion/Plantar flexion intact No cellulitis present Compartment soft Dressing - dressing C/D/I and no drainage, prevena intact with out drainage Motor Function - intact, moving foot and toes well on exam.   Past Medical History:  Diagnosis Date  . Arthritis    all over, takes osteobiflex  . Breast cancer [facility]) 2007   Right breast  . Depression   . GERD (gastroesophageal reflux disease)   . Heart murmur    diagnosed in 40"s, no issues, mitral valve prolapse  . HTN (hypertension)    controlled on meds  . Hyperlipidemia   . Menopausal symptom   .  Osteoporosis   . Personal history of radiation therapy 2007   Right - mammocyte  . Shingles 2014    Assessment/Plan:   1 Day Post-Op Procedure(s) (LRB): TOTAL HIP ARTHROPLASTY ANTERIOR APPROACH (Right) Active Problems:   S/P hip replacement  Estimated body mass index is 24.95 kg/m as calculated from the following:   Height as of this encounter: 5\' 5"  (1.651 m).   Weight as of this encounter: 68 kg. Advance diet Up with therapy  Numbness in right leg resolved. NL motor function RLE Pain controlled Labs and vitals stable Plan on discharge to home with home health PT pending good progress  DVT Prophylaxis - Lovenox, TED hose and SCDs Weight-Bearing as tolerated to right leg   T. Rachelle Hora, PA-C [place] 02/03/2020, 9:41 AM

## 2020-02-03 NOTE — Evaluation (Addendum)
Physical Therapy Evaluation Patient Details Name: Katelyn Knight MRN: [id number] DOB: 05-13-1946 Today's Date: 02/03/2020   History of Present Illness  Katelyn Knight is a 74 year old female with a PMH of breast cancer, HTN, and R hip pain. 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    Clinical Impression  Pt presents in fowler's position in bed after receiving pain medication. Pt is awake but is very lethargic due to pain medication. She is able to perform supine exercises but gets tired and closes her eyes. Verbal and tactile stimuli used to keep her awake. To sit EOB, pt is able to use bedrail for support but needs max+2 assistance for RLE and trunk. VCs utilized to bring hips forward until her feet touches the floor. Pt reports dizziness when sitting so waited until symptoms passed before continuing. For sit<>stand, pt is maxA+2 unable to put much weight through RLE and stand up straight, even with UE support. She ambulated 3 feet with maxA+2 as she needs assistance to hold her up as her RLE buckles and unable to take full weight. Pt's leg gave out in front of the chair so extra support given to sit in chair safely. Pt given a handout of all her exercises and educated to perform exercises throughout her day. Pt left in chair with all needs. She will benefit from skilled PT and continued therapy at SNF in order to strength LE and increase endurance to return to PLOF with minimal difficulty. This entire session was guided, instructed, and directly supervised by Greggory Stallion, DPT.   Follow Up Recommendations SNF    Equipment Recommendations  None recommended by PT    Recommendations for Other Services       Precautions / Restrictions Precautions Precautions: Anterior Hip;Fall Precaution Booklet Issued: Yes (comment) Precaution Comments: No excessive hip extension (ie fencing position) Restrictions Weight Bearing Restrictions: No      Mobility  Bed  Mobility Overal bed mobility: Needs Assistance Bed Mobility: Supine to Sit     Supine to sit: Max assist;+2 for physical assistance     General bed mobility comments: Pt pushed off of bed rail with UE to sit EOB, needed +2 assistance with trunk and LE support; she was dizzy with inital sitting taking increased time to improve  Transfers Overall transfer level: Needs assistance Equipment used: Rolling walker (2 wheeled) Transfers: Sit to/from Stand Sit to Stand: Max assist;+2 physical assistance         General transfer comment: Pt is max +2 A for sit<>stand, requiring multiple attempts to stand for donning underwear. She is able to put weight through UEs but unable to put much weight through RLE and stand up straight  Ambulation/Gait Ambulation/Gait assistance: Max assist;+2 physical assistance Gait Distance (Feet): 3 Feet Assistive device: Rolling walker (2 wheeled) Gait Pattern/deviations: Step-to pattern;Decreased step length - right;Decreased step length - left;Shuffle     General Gait Details: Pt is maxA+2 as she needs assistance to hold her up as her RLE buckles and unable to take full weight. VCs utilized to keep RW close during gait. She has decreased step lengths and takes small shuffling steps  Stairs            Wheelchair Mobility    Modified Rankin (Stroke Patients Only)       Balance Overall balance assessment: Needs assistance Sitting-balance support: Single extremity supported;Feet supported;Bilateral upper extremity supported Sitting balance-Leahy Scale: Fair Sitting balance - Comments: Pt able to maintain  balance with UE support on bed/bedrail and feet on floor   Standing balance support: Bilateral upper extremity supported Standing balance-Leahy Scale: Fair Standing balance comment: Once standing, pt able to maintain balance with BUE support ono walker                             Pertinent Vitals/Pain Pain Assessment: 0-10 Pain  Score: 5  Pain Location: R Hip Pain Descriptors / Indicators: Aching;Constant;Sore;Throbbing Pain Intervention(s): Limited activity within patient's tolerance;Monitored during session;Premedicated before session    [place] expects to be discharged to:: Private residence Living Arrangements: Spouse/significant other Available Help at Discharge: Family Type of Home: House Home Access: Stairs to enter Entrance Stairs-Rails: Right;Left (2 steps she has rails on both sides but unable to reach both) Entrance Stairs-Number of Steps: 4 Home Layout: One level Home Equipment: [place] - 4 wheels;Bedside commode Additional Comments: She notes she can borrow a RW from friend so doesn't need one; Pt has an attic but doesn't go up there.    Prior Function Level of Independence: Independent         Comments: No falls, active, enjoys gardening     Hand Dominance        Extremity/Trunk Assessment   Upper Extremity Assessment Upper Extremity Assessment: Overall WFL for tasks assessed    Lower Extremity Assessment Lower Extremity Assessment: RLE deficits/detail RLE Deficits / Details: Pt unable to achieve full ROM during movement due to surgery with decreased sensation RLE: Unable to fully assess due to pain RLE Sensation: decreased light touch       Communication   Communication: No difficulties  Cognition Arousal/Alertness: Lethargic;Awake/alert Behavior During Therapy: WFL for tasks assessed/performed Overall Cognitive Status: Within Functional Limits for tasks assessed                                 General Comments: Pt is awake but very lethargic due to pain medication. She is able to perform exercises but gets tired and closes her eyes. Tactile and verbal stimuli used to keep her awake      General Comments      Exercises General Exercises - Lower Extremity Ankle Circles/Pumps: AROM;Right;10 reps;Supine Quad Sets: AROM;Right;10  reps;Supine Gluteal Sets: AROM;Both;10 reps;Supine Short Arc Quad: AROM;Right;10 reps;Supine Heel Slides: AAROM;Right;10 reps;Supine Hip ABduction/ADduction: AROM;Right;10 reps;Supine Straight Leg Raises: AAROM;Right;10 reps;Supine Other Exercises Other Exercises: Pt performed sit<>stand to don underwear and had an incontinent episode on floor   Assessment/Plan    PT Assessment Patient needs continued PT services  PT Problem List Decreased strength;Decreased range of motion;Decreased activity tolerance;Decreased balance;Decreased mobility;Decreased knowledge of use of DME;Decreased knowledge of precautions;Impaired sensation;Pain       PT Treatment Interventions Functional mobility training;Therapeutic activities;Therapeutic exercise;Stair training;Gait training;DME instruction;Patient/family education;Neuromuscular re-education;Balance training    PT Goals (Current goals can be found in the Care Plan section)  Acute Rehab PT Goals Patient Stated Goal: "to go home" PT Goal Formulation: With patient Time For Goal Achievement: 02/17/20 Potential to Achieve Goals: Fair Additional Goals Additional Goal #1: Pt will be independent with all bed mobility and transfers in order to return to PLOF with minimal difficulties.    Frequency BID   Barriers to discharge   Pt is weak and not strong enough to stand with minimal support    Co-evaluation  AM-PAC PT "6 Clicks" Mobility  Outcome Measure Help needed turning from your back to your side while in a flat bed without using bedrails?: A Lot Help needed moving from lying on your back to sitting on the side of a flat bed without using bedrails?: A Lot Help needed moving to and from a bed to a chair (including a wheelchair)?: A Lot Help needed standing up from a chair using your arms (e.g., wheelchair or bedside chair)?: Total Help needed to walk in hospital room?: Total Help needed climbing 3-5 steps with a railing? :  Total 6 Click Score: 9    End of Session Equipment Utilized During Treatment: Gait belt Activity Tolerance: Patient limited by fatigue;Patient limited by lethargy;Patient limited by pain Patient left: in chair;with call bell/phone within reach;with chair alarm set;with family/visitor present Nurse Communication: Mobility status PT Visit Diagnosis: Unsteadiness on feet (R26.81);Other abnormalities of gait and mobility (R26.89);Muscle weakness (generalized) (M62.81);Difficulty in walking, not elsewhere classified (R26.2);Pain Pain - Right/Left: Right Pain - part of body: Hip    Time: 2778-2423 PT Time Calculation (min) (ACUTE ONLY): 45 min   Charges:   PT Evaluation $PT Eval High Complexity: 1 High PT Treatments $Therapeutic Exercise: 8-22 mins $Therapeutic Activity: 8-22 mins        Kimmie Jacelyn Grip, SPT [facility] 02/03/2020, 2:18 PM

## 2020-02-03 NOTE — Progress Notes (Signed)
Physical Therapy Treatment Patient Details Name: Katelyn Knight MRN: [id number] DOB: August 14, 1945 Today's Date: 02/03/2020    History of Present Illness Katelyn Knight is a 74 year old female with a PMH of breast cancer, HTN, and R hip pain. 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    PT Comments    Pt presents in recliner chair eating her lunch. Pt is alert/awake and ready to get back into bed. Pt educated for proper technique of sit to stand transfer. When performing transfer, pt is able to use UE for support and his RLE is able more weight this afternoon. Took 2 attempts with mod to max verbal cues before she is able to stand up straight. Pt ambulated 3 feet to get back to EOB with a seated rest break in the middle due to fatigue. Pt is maxA+2 as she needs assistance to hold her up and help faciliate weightshifting onto LLE.  She is able to pick up her feet and take decreased steps to get to the bed; VCs required throughout for proper sequence/technique of gait. For sit to supine, pt needed +2 assistance with trunk and LE support with assistance for proper positioning in bed. Once positioned, performed exercises in bed. Pt left in bed with all needs. She will benefit from skilled PT in order to strength LE and increase endurance to return to PLOF with minimal difficulty. Due to fatigue and increased level of pain, patient would benefit from SNF. Pt is open to recommendation and case manager is notified. This entire session was guided, instructed, and directly supervised by Greggory Stallion, DPT.   Follow Up Recommendations  SNF     Equipment Recommendations  None recommended by PT    Recommendations for Other Services       Precautions / Restrictions Precautions Precautions: Anterior Hip;Fall Precaution Comments: No excessive hip extension (ie fencing position) Restrictions Weight Bearing Restrictions: No    Mobility  Bed Mobility Overal bed mobility:  Needs Assistance Bed Mobility: Sit to Supine       Sit to supine: Max assist;+2 for physical assistance   General bed mobility comments: Pt needed +2 assistance with trunk and LE support with assistance for proper positioning in bed  Transfers Overall transfer level: Needs assistance Equipment used: Rolling walker (2 wheeled) Transfers: Sit to/from Stand Sit to Stand: Max assist;+2 physical assistance         General transfer comment: Pt is max +2 A for sit<>stand; Demonstrated transfer before pt performed. She is able to put weight through UEs, and able to put more weight through RLE to stand up straight; Performed twice as her LEs are weak and fatigue easily. Pt requires VCs for good posture  Ambulation/Gait Ambulation/Gait assistance: Max assist;+2 physical assistance Gait Distance (Feet): 3 Feet Assistive device: Rolling walker (2 wheeled) Gait Pattern/deviations: Step-to pattern;Decreased step length - right;Decreased step length - left     General Gait Details: Pt is maxA+2 as she needs assistance to hold her up and help faciliate weightshifting onto LLE. Ambulation broken up by seated rest break due to fatigue, so 2 bouts needed to get to EOB. She is able to pick up her feet and take decreased steps to get to the bed; VCs required throughout for proper sequence/technique of gait   Stairs             Wheelchair Mobility    Modified Rankin (Stroke Patients Only)       Balance Overall  balance assessment: Needs assistance Sitting-balance support: Feet supported;Single extremity supported;Bilateral upper extremity supported Sitting balance-Leahy Scale: Fair Sitting balance - Comments: Pt able to maintain balance with UE support on bed/bedrail and feet on floor   Standing balance support: Bilateral upper extremity supported Standing balance-Leahy Scale: Fair Standing balance comment: Once standing, pt able to maintain balance with BUE support on walker                             Cognition Arousal/Alertness: Lethargic;Awake/alert Behavior During Therapy: WFL for tasks assessed/performed Overall Cognitive Status: Within Functional Limits for tasks assessed                                 General Comments: Pt is awake but very lethargic but less than this morning. She is able to perform exercises but gets tired and closes her eyes. Tactile and verbal stimuli used to keep her awake      Exercises General Exercises - Lower Extremity Ankle Circles/Pumps: AROM;Right;10 reps;Supine Quad Sets: AROM;Right;10 reps;Supine Gluteal Sets: AROM;Both;10 reps;Supine Short Arc Quad: AROM;Right;10 reps;Supine;AAROM Hip ABduction/ADduction: AROM;Right;10 reps;Supine;AAROM Other Exercises Other Exercises: Pt performed 3 rounds of sit<>stand, 2 during ambulation due to fatigue when walking to EOB    General Comments        Pertinent Vitals/Pain Pain Assessment: 0-10 Pain Score: 8  Pain Location: R Hip Pain Descriptors / Indicators: Aching;Constant;Sore;Throbbing Pain Intervention(s): Monitored during session;Limited activity within patient's tolerance    Home Living                      Prior Function            PT Goals (current goals can now be found in the care plan section) Acute Rehab PT Goals Patient Stated Goal: "to go home" PT Goal Formulation: With patient Time For Goal Achievement: 02/17/20 Potential to Achieve Goals: Fair Additional Goals Additional Goal #1: Pt will be independent with all bed mobility and transfers in order to return to PLOF with minimal difficulties. Progress towards PT goals: Progressing toward goals    Frequency    BID      PT Plan Current plan remains appropriate    Co-evaluation              AM-PAC PT "6 Clicks" Mobility   Outcome Measure  Help needed turning from your back to your side while in a flat bed without using bedrails?: A Lot Help needed moving from  lying on your back to sitting on the side of a flat bed without using bedrails?: A Lot Help needed moving to and from a bed to a chair (including a wheelchair)?: A Lot Help needed standing up from a chair using your arms (e.g., wheelchair or bedside chair)?: Total Help needed to walk in hospital room?: Total Help needed climbing 3-5 steps with a railing? : Total 6 Click Score: 9    End of Session Equipment Utilized During Treatment: Gait belt Activity Tolerance: Patient limited by fatigue;Patient limited by pain Patient left: in bed;with bed alarm set;with call bell/phone within reach Nurse Communication: Mobility status PT Visit Diagnosis: Unsteadiness on feet (R26.81);Other abnormalities of gait and mobility (R26.89);Muscle weakness (generalized) (M62.81);Difficulty in walking, not elsewhere classified (R26.2);Pain Pain - Right/Left: Right Pain - part of body: Hip     Time: 7829-5621 PT Time Calculation (min) (ACUTE ONLY): 29 min  Charges:  $Gait Training: 8-22 mins $Therapeutic Exercise: 8-22 mins                     Kimmie Jacelyn Grip, SPT [facility] 02/03/2020, 4:23 PM

## 2020-02-04 DIAGNOSIS — K219 Gastro-esophageal reflux disease without esophagitis: Secondary | ICD-10-CM | POA: Diagnosis not present

## 2020-02-04 DIAGNOSIS — Z79899 Other long term (current) drug therapy: Secondary | ICD-10-CM | POA: Diagnosis not present

## 2020-02-04 DIAGNOSIS — I129 Hypertensive chronic kidney disease with stage 1 through stage 4 chronic kidney disease, or unspecified chronic kidney disease: Secondary | ICD-10-CM | POA: Diagnosis not present

## 2020-02-04 DIAGNOSIS — I340 Nonrheumatic mitral (valve) insufficiency: Secondary | ICD-10-CM | POA: Diagnosis not present

## 2020-02-04 DIAGNOSIS — N183 Chronic kidney disease, stage 3 unspecified: Secondary | ICD-10-CM | POA: Diagnosis not present

## 2020-02-04 DIAGNOSIS — M1611 Unilateral primary osteoarthritis, right hip: Secondary | ICD-10-CM | POA: Diagnosis not present

## 2020-02-04 LAB — SURGICAL PATHOLOGY

## 2020-02-04 NOTE — Progress Notes (Signed)
Physical Therapy Treatment Patient Details Name: Katelyn Knight MRN: [id number] DOB: July 30, 1945 Today's Date: 02/04/2020    History of Present Illness Katelyn Knight is a 74 year old female with a PMH of breast cancer, HTN, and R hip pain. 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    PT Comments    Pt presents in chair and reports having received pain medication earlier that afternoon. She notes her pain is 4/10 at rest. Pt walked and transferred to bedside commode then walked back to recliner chair CGA throughout. Pt was rolled in the chair to the rehab gym where she performed 2 rounds of 4 steps. Stairs demonstrated/instructed before pt performed. Ascending up, pt has both hands on R rail, leading with L foot. Descending down, pt has both hands on R rail leading with R foot. Pt required verbal and tactile cues for proper technique throughout. 2 rounds of stairs performed with a seated rest break in between. The first round, pt was minA+1 and CGA+1 for safety both directions. During the second round, pt was minA+1 with verbal and tactile cues throughout. Pt definitely more comfortable the second round. Pt walked 80 feet with CGA+2 for chair follow before needing to be wheeled back to her room. She walked 10 feet in 20.4 secs indicating she is a household walker at this time. Performed more exercises seated in chair and encouraged to continue throughout her day. She notes that she is comfortable going up her stairs with help from her husband. Pt left in chair with all needs. Pt will be discharged from acute PT at this time as she is safe to go home. She will benefit from home health PT in order to strength LE and increase endurance to return to PLOF with minimal difficulty. Nurse and case manager are notified.This entire session was guided, instructed, and directly supervised by Greggory Stallion, DPT.   Follow Up Recommendations  Home health PT     Equipment  Recommendations  None recommended by PT    Recommendations for Other Services       Precautions / Restrictions Precautions Precautions: Anterior Hip;Fall Precaution Comments: No excessive hip extension (ie fencing position) Restrictions Weight Bearing Restrictions: No    Mobility  Bed Mobility Overal bed mobility: Needs Assistance Bed Mobility: Sit to Supine     Supine to sit: Mod assist     General bed mobility comments: Pt in chair on arrival to room and left in chair with all needs  Transfers Overall transfer level: Needs assistance Equipment used: Rolling walker (2 wheeled) Transfers: Sit to/from Stand Sit to Stand: Min guard         General transfer comment: Pt is CGA for sit<>stand; VCs utilized for proper hand/RLE placement during transfer;  Ambulation/Gait Ambulation/Gait assistance: Min guard;+2 safety/equipment Gait Distance (Feet): 50 Feet Assistive device: Rolling walker (2 wheeled) Gait Pattern/deviations: Step-to pattern;Decreased step length - right;Decreased step length - left;Step-through pattern     General Gait Details: Pt is CGA+2 for safety and chair follow. Pt able to take small steps with step-through pattern still decreased step length bilaterally   Stairs Stairs: Yes Stairs assistance: Min guard;+2 physical assistance;Min assist Stair Management: One rail Right;One rail Left;Step to pattern;Forwards Number of Stairs: 8 General stair comments: Stairs demonstrated/instructed before pt performed. Going up, pt has both hands on R rail, leading with L foot; Going down, pt has both hands on R rail leading with R foot; Pt required verbal and tactile  cues for proper technique throughout. 2 rounds of stairs performed with a seated rest break in between. The first round, pt was minA+1 and CGA+1 for safety both directions. During the second round, pt was minA+1 with verbal and tactile cues throughout. Pt definitely more comfortable the second round.     Wheelchair Mobility    Modified Rankin (Stroke Patients Only)       Balance Overall balance assessment: Needs assistance Sitting-balance support: Feet supported Sitting balance-Leahy Scale: Good Sitting balance - Comments: Pt able to balance with feet on floor and no UE support   Standing balance support: Bilateral upper extremity supported Standing balance-Leahy Scale: Good Standing balance comment: Once standing, pt able to maintain balance with BUE support on walker                            Cognition Arousal/Alertness: Awake/alert Behavior During Therapy: WFL for tasks assessed/performed Overall Cognitive Status: Within Functional Limits for tasks assessed                                 General Comments: Pt is alert and awake with no evidence of lethargic behavior      Exercises General Exercises - Lower Extremity Ankle Circles/Pumps: AROM;Right;10 reps;Seated Quad Sets: AROM;Right;10 reps;Seated Gluteal Sets: AROM;Both;10 reps;Seated Short Arc Quad: AROM;Right;10 reps;Seated Heel Slides: AAROM;Right;Supine;5 reps Other Exercises Other Exercises: Pt walked to [facility] and performed sit<>stand transfer, then walked back to the chair. Pt cued for proper hand placement    General Comments        Pertinent Vitals/Pain Pain Assessment: 0-10 Pain Score: 4  (6/10 pain stairs/walking) Pain Location: R Hip Pain Descriptors / Indicators: Aching;Constant;Sore;Throbbing Pain Intervention(s): Monitored during session;Premedicated before session    Home Living                      Prior Function            PT Goals (current goals can now be found in the care plan section) Acute Rehab PT Goals Patient Stated Goal: "to go home" PT Goal Formulation: With patient Time For Goal Achievement: 02/17/20 Potential to Achieve Goals: Fair Progress towards PT goals: Progressing toward goals    Frequency    Other (Comment) (Pt is being  discharged from acute PT)      PT Plan Discharge plan needs to be updated    Co-evaluation              AM-PAC PT "6 Clicks" Mobility   Outcome Measure  Help needed turning from your back to your side while in a flat bed without using bedrails?: A Lot Help needed moving from lying on your back to sitting on the side of a flat bed without using bedrails?: A Lot Help needed moving to and from a bed to a chair (including a wheelchair)?: A Little Help needed standing up from a chair using your arms (e.g., wheelchair or bedside chair)?: A Little Help needed to walk in hospital room?: A Little Help needed climbing 3-5 steps with a railing? : A Little 6 Click Score: 16    End of Session Equipment Utilized During Treatment: Gait belt Activity Tolerance: Patient tolerated treatment well;Patient limited by fatigue;Patient limited by pain Patient left: in chair;with call bell/phone within reach;with chair alarm set Nurse Communication: Mobility status PT Visit Diagnosis: Unsteadiness on feet (R26.81);Other abnormalities  of gait and mobility (R26.89);Muscle weakness (generalized) (M62.81);Difficulty in walking, not elsewhere classified (R26.2);Pain Pain - Right/Left: Right Pain - part of body: Hip     Time: 0240-9735 PT Time Calculation (min) (ACUTE ONLY): 53 min  Charges:  $Gait Training: 8-22 mins $Therapeutic Exercise: 8-22 mins                      Kimmie Jacelyn Grip, SPT [facility] 02/04/2020, 3:47 PM

## 2020-02-04 NOTE — TOC Initial Note (Deleted)
Transition of Care [facility]) - Initial/Assessment Note    Patient Details  Name: Katelyn Knight MRN: [id number] Date of Birth: [birth year]  [facility]) CM/SW Contact:    Shelbie Ammons, RN Phone Number: 02/04/2020, 9:56 AM  Clinical Narrative:    RNCM met with patient at bedside. Patient reports to feeling better today although pain is still there. Patient reports that it is her plan to go home. 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 Patient is open to home health services and does not have any preference as to which agency as long as they accept her insurance.  RNCM reached out to [place] with [facility] and she will accept referral.              Expected Discharge Plan: [place] Barriers to Discharge: No Barriers Identified   Patient Goals and CMS Choice        Expected Discharge Plan and Services Expected Discharge Plan: [place] Choice: [place] arrangements for the past 2 months: Single Family Home Expected Discharge Date: 02/02/20                         HH Arranged: PT HH Agency: [facility] ([place]) Date HH Agency Contacted: 02/04/20 Time [place]: [phone] Representative spoke with at [place]: [place] Arrangements/Services Living arrangements for the past 2 months: [place] with:: Spouse Patient language and need for interpreter reviewed:: Yes Do you feel safe going back to the place where you live?: Yes      Need for Family Participation in Patient Care: Yes (Comment) Care giver support system in place?: Yes (comment)   Criminal Activity/Legal Involvement Pertinent to Current Situation/Hospitalization: No - Comment as needed  Activities of Daily Living Home Assistive Devices/Equipment: None ADL Screening  (condition at time of admission) Patient's cognitive ability adequate to safely complete daily activities?: Yes Is the patient deaf or have difficulty hearing?: No Does the patient have difficulty seeing, even when wearing glasses/contacts?: No Does the patient have difficulty concentrating, remembering, or making decisions?: No Patient able to express need for assistance with ADLs?: Yes Does the patient have difficulty dressing or bathing?: Yes Independently performs ADLs?: No Communication: Independent Dressing (OT): Needs assistance Is this a change from baseline?: Change from baseline, expected to last <3days Grooming: Independent Feeding: Independent Bathing: Needs assistance Is this a change from baseline?: Change from baseline, expected to last <3 days Toileting: Needs assistance Is this a change from baseline?: Change from baseline, expected to last <3 days In/Out Bed: Needs assistance Is this a change from baseline?: Change from baseline, expected to last <3 days Walks in Home: Independent Does the patient have difficulty walking or climbing stairs?: Yes Weakness of Legs: Right Weakness of Arms/Hands: None  Permission Sought/Granted                  Emotional Assessment Appearance:: Appears stated age Attitude/Demeanor/Rapport: Engaged Affect (typically observed): Appropriate, Calm Orientation: : Oriented to Self, Oriented to Place, Oriented to  Time, Oriented to Situation Alcohol / Substance Use: Not Applicable Psych Involvement: No (comment)  Admission diagnosis:  S/P hip replacement [Z96.649] Patient Active Problem List   Diagnosis Date Noted  .  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 Symptomatic cholelithiasis 09/03/2019  . Left arm pain 10/13/2018  . Memory difficulty 10/13/2018  . Chronic kidney disease (CKD), stage III (moderate) 04/09/2018  . History of mitral valve prolapse 11/28/2017  . Knee pain 08/21/2017  . Epigastric pain 01/22/2017  . Decreased  energy 01/22/2017  . Encounter for general adult medical examination with abnormal findings 12/30/2014  . Vaginal pessary in situ 09/02/2013  . 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 Prolapse of anterior vaginal wall 07/02/2013  . 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 Dupuytren's contracture of both hands 06/04/2012  . 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 Depression 07/11/2011  . Hypertension 05/30/2011  . GERD (gastroesophageal reflux disease) 05/30/2011   PCP:  Leonides Sake, MD Pharmacy:   [id number] [place], [id number]: [phone]: [phone]    Social Determinants of Health (SDOH) Interventions    Readmission Risk Interventions No flowsheet data found.

## 2020-02-04 NOTE — Progress Notes (Signed)
Physical Therapy Treatment Patient Details Name: Katelyn Knight MRN: [id number] DOB: [birth year] Today's Date: 02/04/2020    History of Present Illness Katelyn Knight is a 74 year old female with a PMH of breast cancer, HTN, and R hip pain. 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    PT Comments    Pt presents in bed after receiving pain medication earlier that morning. She notes the numbness has gone away and her pain is 2/10 resting. Pt is modA+1 during bed mobility as she is able to sit EOB with increased time and assistance with trunk, UE, and LE support. Pt is modA +2 for sit to stand transfer. Pt able to stand up on first try with VCs to push with UEs. Pt able to put weight on RLE without buckling. Pt notes dizziness upon standing, so took a moment for dizziness to vanish before walking. She notes pain is a 5/10. Pt walked 150 feet, modA+2 for safety and help with chair follow. For first 75 feet, pt was shuffling her feet, taking small steps with step to pattern. Then the second half, patient able to take bigger steps with some step-through pattern still with decreased step length bilaterally. After walking distance, pt needed to sit down in chair and was rolled back to her room. VCs utilized for proper technique with RW. Performed more exercises seated in chair and encouraged to continue throughout her day. Pt left in chair with all needs. She will benefit from skilled PT in order to strength LE and increase endurance to return to PLOF with minimal difficulty.Current recommendations of SNF still appropriate as patient's activity tolerance is still limited and stairs not yet attempted. Case manager is notified. This entire session was guided, instructed, and directly supervised by Greggory Stallion, DPT.   Follow Up Recommendations  SNF     Equipment Recommendations  None recommended by PT    Recommendations for Other Services       Precautions / Restrictions  Precautions Precautions: Anterior Hip;Fall Precaution Comments: No excessive hip extension (ie fencing position) Restrictions Weight Bearing Restrictions: No    Mobility  Bed Mobility Overal bed mobility: Needs Assistance Bed Mobility: Sit to Supine     Supine to sit: Mod assist     General bed mobility comments: Pt able to come to EOB with modA+1 assistance with trunk and LE support; pt took increased time with mobility  Transfers Overall transfer level: Needs assistance Equipment used: Rolling walker (2 wheeled) Transfers: Sit to/from Stand Sit to Stand: Mod assist;+2 safety/equipment         General transfer comment: Pt is modA +2 for sit<>stand; Demonstrated/instructed transfer before pt performed. Pt able to stand up on first try with VCs to push with UEs. Pt able to put weight on RLE without buckling.  Ambulation/Gait Ambulation/Gait assistance: +2 safety/equipment;Mod assist Gait Distance (Feet): 150 Feet Assistive device: Rolling walker (2 wheeled) Gait Pattern/deviations: Step-to pattern;Decreased step length - right;Decreased step length - left     General Gait Details: Pt is modA+2 for safety and help pulling chair behind pt. Pt notes dizziness upon standing, so took a moment for dizziness to vanish before walking; For first 75 feet, pt was shuffling her feet, taking small steps with step to pattern. Then the second half, patient able to take bigger steps with some step-through pattern still with decreased step length bilaterally; Pt cued for proper technique utilizing RW   Stairs  Wheelchair Mobility    Modified Rankin (Stroke Patients Only)       Balance Overall balance assessment: Needs assistance Sitting-balance support: Feet supported;No upper extremity supported Sitting balance-Leahy Scale: Good Sitting balance - Comments: Pt able to balance with feet on floor and no UE support   Standing balance support: Bilateral upper extremity  supported Standing balance-Leahy Scale: Fair Standing balance comment: Once standing, pt able to maintain balance with BUE support on walker                            Cognition Arousal/Alertness: Awake/alert Behavior During Therapy: WFL for tasks assessed/performed Overall Cognitive Status: Within Functional Limits for tasks assessed                                 General Comments: Pt is alert and awake with no evidence of lethargic behavior      Exercises General Exercises - Lower Extremity Ankle Circles/Pumps: AROM;Right;10 reps;Supine Quad Sets: AROM;Right;10 reps;Supine Short Arc Quad: AROM;Right;10 reps;AAROM;Seated Heel Slides: AAROM;Right;Supine;5 reps    General Comments        Pertinent Vitals/Pain Pain Assessment: 0-10 Pain Score: 5  (2/10 resting; 5/10 with movement) Pain Location: R Hip Pain Descriptors / Indicators: Aching;Constant;Sore;Throbbing Pain Intervention(s): Monitored during session;Premedicated before session    Home Living                      Prior Function            PT Goals (current goals can now be found in the care plan section) Acute Rehab PT Goals Patient Stated Goal: "to go home" PT Goal Formulation: With patient Time For Goal Achievement: 02/17/20 Potential to Achieve Goals: Fair Progress towards PT goals: Progressing toward goals    Frequency    BID      PT Plan Current plan remains appropriate    Co-evaluation              AM-PAC PT "6 Clicks" Mobility   Outcome Measure  Help needed turning from your back to your side while in a flat bed without using bedrails?: A Lot Help needed moving from lying on your back to sitting on the side of a flat bed without using bedrails?: A Lot Help needed moving to and from a bed to a chair (including a wheelchair)?: A Lot Help needed standing up from a chair using your arms (e.g., wheelchair or bedside chair)?: A Lot Help needed to walk in  hospital room?: A Lot Help needed climbing 3-5 steps with a railing? : Total 6 Click Score: 11    End of Session Equipment Utilized During Treatment: Gait belt Activity Tolerance: Patient tolerated treatment well;Patient limited by fatigue;Patient limited by pain Patient left: in chair;with call bell/phone within reach;with chair alarm set Nurse Communication: Mobility status PT Visit Diagnosis: Unsteadiness on feet (R26.81);Other abnormalities of gait and mobility (R26.89);Muscle weakness (generalized) (M62.81);Difficulty in walking, not elsewhere classified (R26.2);Pain Pain - Right/Left: Right Pain - part of body: Hip     Time: 1443-1540 PT Time Calculation (min) (ACUTE ONLY): 26 min  Charges:  $Gait Training: 8-22 mins $Therapeutic Exercise: 8-22 mins                       Kimmie Jacelyn Grip, SPT Bernita Raisin 02/04/2020, 3:08 PM

## 2020-02-04 NOTE — Evaluation (Signed)
Occupational Therapy Evaluation Patient Details Name: Katelyn Knight MRN: [id number] DOB: [birth year] Today's Date: 02/04/2020    History of Present Illness Katelyn Knight is a 74 year old female with a PMH of breast cancer, HTN, and R hip pain. 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   Clinical Impression   Pt was seen for OT evaluation this date. Prior to hospital admission, pt was Indep with all ADLs and fxl mobility. Pt lives with spouse in one level home with 4 STE. Currently pt demonstrates impairments as described below (See OT problem list) which functionally limit her ability to perform ADL/self-care tasks. Pt currently requires MOD A with LB ADLs d/t limited R LE ROM.  Pt would benefit from skilled OT services to address noted impairments and functional limitations (see below for any additional details) in order to maximize safety and independence while minimizing falls risk and caregiver burden. Upon hospital discharge, recommend HHOT to maximize pt safety and return to functional independence during meaningful occupations of daily life. Of note: pt expressed dissatisfaction with not being able to discharge home after surgery, OT tries to provide education, therapeutic listening and direct some of patient's concerns to the correct resource such as nursing for wound vac management questions. Pt left in chair with chair alarm, SCDs and table close with all necessary items. OT notified RN of pt questions.     Follow Up Recommendations  Home health OT;Supervision - Intermittent    Equipment Recommendations  3 in 1 bedside commode    Recommendations for Other Services       Precautions / Restrictions Precautions Precautions: Anterior Hip;Fall Precaution Comments: No excessive hip extension (ie fencing position) Restrictions Weight Bearing Restrictions: No      Mobility Bed Mobility               General bed mobility comments: Pt in chair on  arrival to room and left in chair with all needs  Transfers          General transfer comment: Per PT note: CGA for sit<>stand with RW   Balance                              ADL either performed or assessed with clinical judgement   ADL Overall ADL's : Needs assistance/impaired                                       General ADL Comments: MOD A for LB ADLs d/t some limited ROM with R LE, AE education initiated     Vision Baseline Vision/History: Wears glasses Wears Glasses: At all times Patient Visual Report: No change from baseline       Perception     Praxis      Pertinent Vitals/Pain Pain Assessment: 0-10 Pain Score: 5  Pain Location: R Hip Pain Descriptors / Indicators: Aching;Constant;Sore;Throbbing Pain Intervention(s): Monitored during session;Premedicated before session     Hand Dominance     Extremity/Trunk Assessment Upper Extremity Assessment Upper Extremity Assessment: Overall WFL for tasks assessed;Generalized weakness (ROM WFL, MMT grossly 4-/5)   Lower Extremity Assessment Lower Extremity Assessment: Defer to PT evaluation       Communication Communication Communication: No difficulties   Cognition Arousal/Alertness: Awake/alert Behavior During Therapy: WFL for tasks assessed/performed Overall Cognitive Status: Within Functional Limits for tasks  assessed                                 General Comments: Pt is somewhat agitated about situation with discharge   General Comments       Exercises  Other Exercises: OT provides ed re: bathing/shower considerations, LB ADLs with AE. Pt and spouse with good understanding. Pt with limited interest in education provided.   Shoulder Instructions      Home Living Family/patient expects to be discharged to:: Private residence Living Arrangements: Spouse/significant other Available Help at Discharge: Family Type of Home: House Home Access: Stairs to  enter Technical brewer of Steps: 4 Entrance Stairs-Rails: Right;Left Home Layout: One level     Bathroom Shower/Tub: Occupational psychologist: Handicapped height Bathroom Accessibility: Yes   Home Equipment: Environmental consultant - 4 wheels;Bedside commode   Additional Comments: She notes she can borrow a RW from friend so doesn't need one; Pt has an attic but doesn't go up there.      Prior Functioning/Environment Level of Independence: Independent        Comments: No falls, active, enjoys gardening        OT Problem List: Decreased strength;Decreased range of motion;Decreased activity tolerance;Impaired balance (sitting and/or standing);Decreased knowledge of use of DME or AE;Decreased knowledge of precautions      OT Treatment/Interventions: Self-care/ADL training;DME and/or AE instruction;Therapeutic exercise;Therapeutic activities;Patient/family education    OT Goals(Current goals can be found in the care plan section) Acute Rehab OT Goals Patient Stated Goal: "to go home" OT Goal Formulation: With patient Time For Goal Achievement: 02/18/20 Potential to Achieve Goals: Good ADL Goals Pt Will Perform Lower Body Dressing: with set-up;with adaptive equipment;sit to/from stand Pt Will Transfer to Toilet: with supervision;ambulating;grab bars Pt Will Perform Toileting - Clothing Manipulation and hygiene: with min guard assist;sit to/from stand  OT Frequency: Min 1X/week   Barriers to D/C:            Co-evaluation              AM-PAC OT "6 Clicks" Daily Activity     Outcome Measure Help from another person eating meals?: None Help from another person taking care of personal grooming?: A Little Help from another person toileting, which includes using toliet, bedpan, or urinal?: A Little Help from another person bathing (including washing, rinsing, drying)?: A Little Help from another person to put on and taking off regular upper body clothing?: None Help from  another person to put on and taking off regular lower body clothing?: A Little 6 Click Score: 20   End of Session Nurse Communication: Other (comment) (notified pt wants more information about wound vac mgt)  Activity Tolerance: Patient tolerated treatment well Patient left: in chair;with call bell/phone within reach;with chair alarm set  OT Visit Diagnosis: Unsteadiness on feet (R26.81);Muscle weakness (generalized) (M62.81)                Time: 7169-6789 OT Time Calculation (min): 38 min Charges:  OT General Charges $OT Visit: 1 Visit OT Evaluation $OT Eval Moderate Complexity: 1 Mod OT Treatments $Self Care/Home Management : 23-37 mins  Gerrianne Scale, MS, OTR/L ascom [phone] 02/04/20, 5:18 PM

## 2020-02-04 NOTE — Plan of Care (Signed)
  Problem: Education: Goal: Knowledge of General Education information will improve Description: Including pain rating scale, medication(s)/side effects and non-pharmacologic comfort measures Outcome: Progressing   Problem: Health Behavior/Discharge Planning: Goal: Ability to manage health-related needs will improve Outcome: Progressing   Problem: Clinical Measurements: Goal: Ability to maintain clinical measurements within normal limits will improve Outcome: Progressing Goal: Will remain free from infection Outcome: Progressing Goal: Diagnostic test results will improve Outcome: Progressing Goal: Respiratory complications will improve Outcome: Progressing Goal: Cardiovascular complication will be avoided Outcome: Progressing   Problem: Activity: Goal: Risk for activity intolerance will decrease Outcome: Progressing   Problem: Nutrition: Goal: Adequate nutrition will be maintained Outcome: Progressing   Problem: Coping: Goal: Level of anxiety will decrease Outcome: Progressing   Problem: Elimination: Goal: Will not experience complications related to bowel motility Outcome: Progressing Goal: Will not experience complications related to urinary retention Outcome: Progressing   Problem: Pain Managment: Goal: General experience of comfort will improve Outcome: Progressing   Problem: Activity: Goal: Ability to avoid complications of mobility impairment will improve Outcome: Progressing Goal: Ability to tolerate increased activity will improve Outcome: Progressing   Problem: Pain Management: Goal: Pain level will decrease with appropriate interventions Outcome: Progressing   Problem: Skin Integrity: Goal: Will show signs of wound healing Outcome: Progressing

## 2020-02-04 NOTE — Progress Notes (Signed)
° °  Subjective: 2 Days Post-Op Procedure(s) (LRB): TOTAL HIP ARTHROPLASTY ANTERIOR APPROACH (Right) Patient reports pain as mild.   Patient is well, and has had no acute complaints or problems Denies any CP, SOB, ABD pain. We will continue therapy today.  Plan is to go Home after hospital stay.  Objective: Vital signs in last 24 hours: Temp:  [98.2 F (36.8 C)-99.4 F (37.4 C)] 98.2 F (36.8 C) (09/23 0752) Pulse Rate:  [66-74] 66 (09/23 0752) Resp:  [14-18] 16 (09/23 0752) BP: (105-124)/(50-59) 115/58 (09/23 0752) SpO2:  [94 %-100 %] 97 % (09/23 0752)  Intake/Output from previous day: 09/22 0701 - 09/23 0700 In: 567.3 [I.V.:567.3] Out: -  Intake/Output this shift: No intake/output data recorded.  Recent Labs    02/02/20 1305  HGB 11.1*   Recent Labs    02/02/20 1305  WBC 8.6  RBC 3.50*  HCT 32.9*  PLT 130*   Recent Labs    02/02/20 1305  NA 134*  K 3.5  CL 100  CO2 24  BUN 13  CREATININE 0.92  GLUCOSE 192*  CALCIUM 8.5*   No results for input(s): LABPT, INR in the last 72 hours.  EXAM General - Patient is Alert, Appropriate and Oriented Extremity - Neurovascular intact Sensation intact distally Intact pulses distally Dorsiflexion/Plantar flexion intact No cellulitis present Compartment soft Dressing - dressing C/D/I and no drainage, prevena intact with out drainage Motor Function - intact, moving foot and toes well on exam.   Past Medical History:  Diagnosis Date   Arthritis    all over, takes osteobiflex   Breast cancer ([place]) 2007   Right breast   Depression    GERD (gastroesophageal reflux disease)    Heart murmur    diagnosed in 40"s, no issues, mitral valve prolapse   HTN (hypertension)    controlled on meds   Hyperlipidemia    Menopausal symptom    Osteoporosis    Personal history of radiation therapy 2007   Right - mammocyte   Shingles 2014    Assessment/Plan:   2 Days Post-Op Procedure(s) (LRB): TOTAL HIP  ARTHROPLASTY ANTERIOR APPROACH (Right) Active Problems:   S/P hip replacement  Estimated body mass index is 24.95 kg/m as calculated from the following:   Height as of this encounter: 5\' 5"  (1.651 m).   Weight as of this encounter: 68 kg. Advance diet Up with therapy  Pain controlled Labs and vitals stable Plan on discharge to home with home health PT today pending good progress  DVT Prophylaxis - Lovenox, TED hose and SCDs Weight-Bearing as tolerated to right leg   T. Rachelle Hora, PA-C [place] 02/04/2020, 8:38 AM

## 2020-02-04 NOTE — Discharge Summary (Signed)
Physician Discharge Summary  Patient ID: Katelyn Knight MRN: [id number] DOB/AGE: 20-Aug-1945 74 y.o.  Admit date: 02/02/2020 Discharge date: 02/04/2020  Admission Diagnoses:  S/P hip replacement [Z96.649]   Discharge Diagnoses: Patient Active Problem List   Diagnosis Date Noted  . 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 Symptomatic cholelithiasis 09/03/2019  . Left arm pain 10/13/2018  . Memory difficulty 10/13/2018  . Chronic kidney disease (CKD), stage III (moderate) 04/09/2018  . History of mitral valve prolapse 11/28/2017  . Knee pain 08/21/2017  . Epigastric pain 01/22/2017  . Decreased energy 01/22/2017  . Encounter for general adult medical examination with abnormal findings 12/30/2014  . Vaginal pessary in situ 09/02/2013  . 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 Prolapse of anterior vaginal wall 07/02/2013  . 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 Dupuytren's contracture of both hands 06/04/2012  . 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 Depression 07/11/2011  . Hypertension 05/30/2011  . GERD (gastroesophageal reflux disease) 05/30/2011    Past Medical History:  Diagnosis Date  . Arthritis    all over, takes osteobiflex  . Breast cancer [facility]) 2007   Right breast  . Depression   . GERD (gastroesophageal reflux disease)   . Heart murmur    diagnosed in 40"s, no issues, mitral valve prolapse  . HTN (hypertension)    controlled on meds  . Hyperlipidemia   . Menopausal symptom   . Osteoporosis   . Personal history of radiation therapy 2007   Right - mammocyte  . Shingles 2014     Transfusion: none   Consultants (if any):   Discharged Condition: Improved  Hospital Course: Katelyn Knight is an 74 y.o. female who was admitted 02/02/2020 with a diagnosis of right hip osteoarhtritis and went to the operating room on 02/02/2020 and underwent the above named procedures.    Surgeries: Procedure(s): TOTAL HIP ARTHROPLASTY ANTERIOR APPROACH on 02/02/2020 Patient  tolerated the surgery well. Taken to PACU where she was stabilized and then transferred to the orthopedic floor.  Started on Lovenox 40mg  q 24 hrs. Foot pumps applied bilaterally at 80 mm. Heels elevated on bed with rolled towels. No evidence of DVT. Negative Homan. Physical therapy started on day #1 for gait training and transfer. OT started day #1 for ADL and assisted devices.  Patient's foley was d/c on day #1. Patient's IV was d/c on day #2.  On post op day #2 patient was stable and ready for discharge to home with HHPT.  Implants: Medacta AMIS 1 standard stem, metal S 28 mm head, 48 mm mPACT DM cup and liner  She was given perioperative antibiotics:  Anti-infectives (From admission, onward)   Start     Dose/Rate Route Frequency Ordered Stop   02/02/20 1330  ceFAZolin (ANCEF) IVPB 1 g/50 mL premix        1 g 100 mL/hr over 30 Minutes Intravenous Every 6 hours 02/02/20 1047 02/02/20 2100   02/02/20 1322  ceFAZolin (ANCEF) 1-4 GM/50ML-% IVPB       Note to Pharmacy: Alysia Penna   : cabinet override      02/02/20 1322 02/03/20 0129   02/02/20 0619  ceFAZolin (ANCEF) 2-4 GM/100ML-% IVPB       Note to Pharmacy: Myles Lipps   : cabinet override      02/02/20 0619 02/02/20 0746   02/02/20 0600  ceFAZolin (ANCEF) IVPB 2g/100 mL premix        2 g 200 mL/hr over 30 Minutes Intravenous On  call to O.R. 02/02/20 [id number] 02/02/20 6967    .  She was given sequential compression devices, early ambulation, and Lovenox TEDs for DVT prophylaxis.  She benefited maximally from the hospital stay and there were no complications.    Recent vital signs:  Vitals:   02/03/20 2335 02/04/20 0752  BP: (!) 113/56 (!) 115/58  Pulse: 74 66  Resp: 18 16  Temp: 99.4 F (37.4 C) 98.2 F (36.8 C)  SpO2: 94% 97%    Recent laboratory studies:  Lab Results  Component Value Date   HGB 11.1 (L) 02/02/2020   HGB 14.8 01/28/2020   HGB 12.7 09/04/2019   Lab Results  Component Value Date   WBC 8.6  02/02/2020   PLT 130 (L) 02/02/2020   No results found for: INR Lab Results  Component Value Date   NA 134 (L) 02/02/2020   K 3.5 02/02/2020   CL 100 02/02/2020   CO2 24 02/02/2020   BUN 13 02/02/2020   CREATININE 0.92 02/02/2020   GLUCOSE 192 (H) 02/02/2020    Discharge Medications:   Allergies as of 02/04/2020      Reactions   Rosuvastatin Other (See Comments)   Severe acid reflux      Medication List    TAKE these medications   BETAINE PO Take 1-2 capsules by mouth daily. TERRA HEALTH REFLUX INHIBITOR (BETAINE & PEPSIN)   DULoxetine 30 MG capsule Commonly known as: CYMBALTA Take 30 mg by mouth every evening.   enoxaparin 40 MG/0.4ML injection Commonly known as: LOVENOX Inject 0.4 mLs (40 mg total) into the skin daily for 10 days.   escitalopram 10 MG tablet Commonly known as: LEXAPRO TAKE 1 TABLET BY MOUTH  DAILY   HYDROcodone-acetaminophen 5-325 MG tablet Commonly known as: Norco Take 1-2 tablets by mouth every 4 (four) hours as needed for moderate pain.   LICORICE PO Take 1 capsule by mouth daily. [place] Acid Relief (S[place]ppery [place]m Extract/Chamomile Extract/Deglycyrrhizinated Licorice Extract/Aloe Vera Extract/Lemon Balm Extract)   losartan-hydrochlorothiazide 100-12.5 MG tablet Commonly known as: HYZAAR TAKE 1 TABLET BY MOUTH  DAILY   Osteo Bi-Flex Adv Double St Tabs Take 1 tablet by mouth daily.   Prevagen Extra Strength 20 MG Caps Generic drug: Apoaequorin Take 1 capsule by mouth daily.   PROBIOTIC PO Take 1 capsule by mouth daily.            Durable Medical Equipment  (From admission, onward)         Start     Ordered   02/02/20 1047  DME Bedside commode  Once       Question:  Patient needs a bedside commode to treat with the following condition  Answer:  S/P hip replacement   02/02/20 1046   02/02/20 1047  DME Walker rolling  Once       Question Answer Comment  Walker: With [place] [facility]   Patient needs a walker to treat with  the following condition S/P hip replacement      02/02/20 1046   02/02/20 1047  DME 3 n 1  Once        02/02/20 1046          Diagnostic Studies: DG HIP OPERATIVE UNILAT W OR W/O PELVIS RIGHT  Result Date: 02/02/2020 CLINICAL DATA:  Right hip replacement EXAM: OPERATIVE RIGHT HIP (WITH PELVIS IF PERFORMED) 3 VIEWS TECHNIQUE: Fluoroscopic spot image(s) were submitted for interpretation post-operatively. COMPARISON:  07/16/2019 FINDINGS: Intraoperative spot images demonstrate changes of right  hip replacement. Normal AP alignment. No hardware bony complicating feature. IMPRESSION: Right hip replacement.  No visible complicating feature. Electronically Signed   By: Rolm Baptise M.D.   On: 02/02/2020 08:54   DG HIP UNILAT W OR W/O PELVIS 2-3 VIEWS RIGHT  Result Date: 02/02/2020 CLINICAL DATA:  Right hip replacement EXAM: DG HIP (WITH OR WITHOUT PELVIS) 2-3V RIGHT COMPARISON:  07/16/2019 FINDINGS: Total right hip arthroplasty that is well seated. No evidence of periprosthetic fracture. IMPRESSION: Total right hip arthroplasty without complicating features. Electronically Signed   By: Monte Fantasia M.D.   On: 02/02/2020 09:34    Disposition: Discharge disposition: 01-Home or Self Care          Follow-up Information    Duanne Guess, PA-C On 02/16/2020.   Specialties: Orthopedic Surgery, Emergency Medicine Why: For suture removal; @ 2:45 pm Contact information: [place] [address] [phone]                Signed: Feliberto Gottron 02/04/2020, 8:40 AM

## 2020-02-05 DIAGNOSIS — Z96641 Presence of right artificial hip joint: Secondary | ICD-10-CM | POA: Diagnosis not present

## 2020-02-05 DIAGNOSIS — Z9181 History of falling: Secondary | ICD-10-CM | POA: Diagnosis not present

## 2020-02-05 DIAGNOSIS — Z471 Aftercare following joint replacement surgery: Secondary | ICD-10-CM | POA: Diagnosis not present

## 2020-02-05 DIAGNOSIS — Z853 Personal history of malignant neoplasm of breast: Secondary | ICD-10-CM | POA: Diagnosis not present

## 2020-02-08 DIAGNOSIS — Z96641 Presence of right artificial hip joint: Secondary | ICD-10-CM | POA: Diagnosis not present

## 2020-02-08 DIAGNOSIS — Z9181 History of falling: Secondary | ICD-10-CM | POA: Diagnosis not present

## 2020-02-08 DIAGNOSIS — Z471 Aftercare following joint replacement surgery: Secondary | ICD-10-CM | POA: Diagnosis not present

## 2020-02-08 DIAGNOSIS — Z853 Personal history of malignant neoplasm of breast: Secondary | ICD-10-CM | POA: Diagnosis not present

## 2020-02-09 DIAGNOSIS — Z853 Personal history of malignant neoplasm of breast: Secondary | ICD-10-CM | POA: Diagnosis not present

## 2020-02-09 DIAGNOSIS — Z471 Aftercare following joint replacement surgery: Secondary | ICD-10-CM | POA: Diagnosis not present

## 2020-02-09 DIAGNOSIS — Z9181 History of falling: Secondary | ICD-10-CM | POA: Diagnosis not present

## 2020-02-09 DIAGNOSIS — Z96641 Presence of right artificial hip joint: Secondary | ICD-10-CM | POA: Diagnosis not present

## 2020-02-10 ENCOUNTER — Other Ambulatory Visit: Payer: Self-pay | Admitting: Orthopedic Surgery

## 2020-02-10 ENCOUNTER — Ambulatory Visit
Admission: RE | Admit: 2020-02-10 | Discharge: 2020-02-10 | Disposition: A | Discharge status: Home / Self Care | Payer: Medicare Other | Source: Ambulatory Visit | Attending: Orthopedic Surgery | Admitting: Orthopedic Surgery

## 2020-02-10 ENCOUNTER — Other Ambulatory Visit: Payer: Self-pay

## 2020-02-10 DIAGNOSIS — M25551 Pain in right hip: Secondary | ICD-10-CM

## 2020-02-10 DIAGNOSIS — M79604 Pain in right leg: Secondary | ICD-10-CM | POA: Insufficient documentation

## 2020-02-10 DIAGNOSIS — M79605 Pain in left leg: Secondary | ICD-10-CM | POA: Diagnosis not present

## 2020-02-10 DIAGNOSIS — M7989 Other specified soft tissue disorders: Secondary | ICD-10-CM | POA: Diagnosis not present

## 2020-02-12 DIAGNOSIS — Z853 Personal history of malignant neoplasm of breast: Secondary | ICD-10-CM | POA: Diagnosis not present

## 2020-02-12 DIAGNOSIS — Z471 Aftercare following joint replacement surgery: Secondary | ICD-10-CM | POA: Diagnosis not present

## 2020-02-12 DIAGNOSIS — Z9181 History of falling: Secondary | ICD-10-CM | POA: Diagnosis not present

## 2020-02-12 DIAGNOSIS — Z96641 Presence of right artificial hip joint: Secondary | ICD-10-CM | POA: Diagnosis not present

## 2020-02-15 DIAGNOSIS — Z471 Aftercare following joint replacement surgery: Secondary | ICD-10-CM | POA: Diagnosis not present

## 2020-02-15 DIAGNOSIS — Z853 Personal history of malignant neoplasm of breast: Secondary | ICD-10-CM | POA: Diagnosis not present

## 2020-02-15 DIAGNOSIS — Z96641 Presence of right artificial hip joint: Secondary | ICD-10-CM | POA: Diagnosis not present

## 2020-02-15 DIAGNOSIS — Z9181 History of falling: Secondary | ICD-10-CM | POA: Diagnosis not present

## 2020-02-17 DIAGNOSIS — Z96641 Presence of right artificial hip joint: Secondary | ICD-10-CM | POA: Diagnosis not present

## 2020-02-17 DIAGNOSIS — Z853 Personal history of malignant neoplasm of breast: Secondary | ICD-10-CM | POA: Diagnosis not present

## 2020-02-17 DIAGNOSIS — Z9181 History of falling: Secondary | ICD-10-CM | POA: Diagnosis not present

## 2020-02-17 DIAGNOSIS — Z471 Aftercare following joint replacement surgery: Secondary | ICD-10-CM | POA: Diagnosis not present

## 2020-03-02 ENCOUNTER — Other Ambulatory Visit: Payer: Self-pay | Admitting: Family Medicine

## 2020-03-02 DIAGNOSIS — Z1231 Encounter for screening mammogram for malignant neoplasm of breast: Secondary | ICD-10-CM

## 2020-03-16 DIAGNOSIS — Z96641 Presence of right artificial hip joint: Secondary | ICD-10-CM | POA: Diagnosis not present

## 2020-03-16 DIAGNOSIS — M1611 Unilateral primary osteoarthritis, right hip: Secondary | ICD-10-CM | POA: Diagnosis not present

## 2020-04-04 ENCOUNTER — Ambulatory Visit
Admission: RE | Admit: 2020-04-04 | Discharge: 2020-04-04 | Disposition: A | Discharge status: Home / Self Care | Payer: Medicare Other | Source: Ambulatory Visit | Attending: Family Medicine | Admitting: Family Medicine

## 2020-04-04 ENCOUNTER — Other Ambulatory Visit: Payer: Self-pay

## 2020-04-04 DIAGNOSIS — Z1231 Encounter for screening mammogram for malignant neoplasm of breast: Secondary | ICD-10-CM | POA: Diagnosis not present

## 2020-05-31 IMAGING — MG MM DIGITAL SCREENING BILAT W/ TOMO W/ CAD
8 series · 9 of 24 positions shown · non-contrast
Comparison: Previous exam(s).

CLINICAL DATA: Screening.

EXAM:
DIGITAL SCREENING BILATERAL MAMMOGRAM WITH TOMO AND CAD

[R MLO synth-2D]
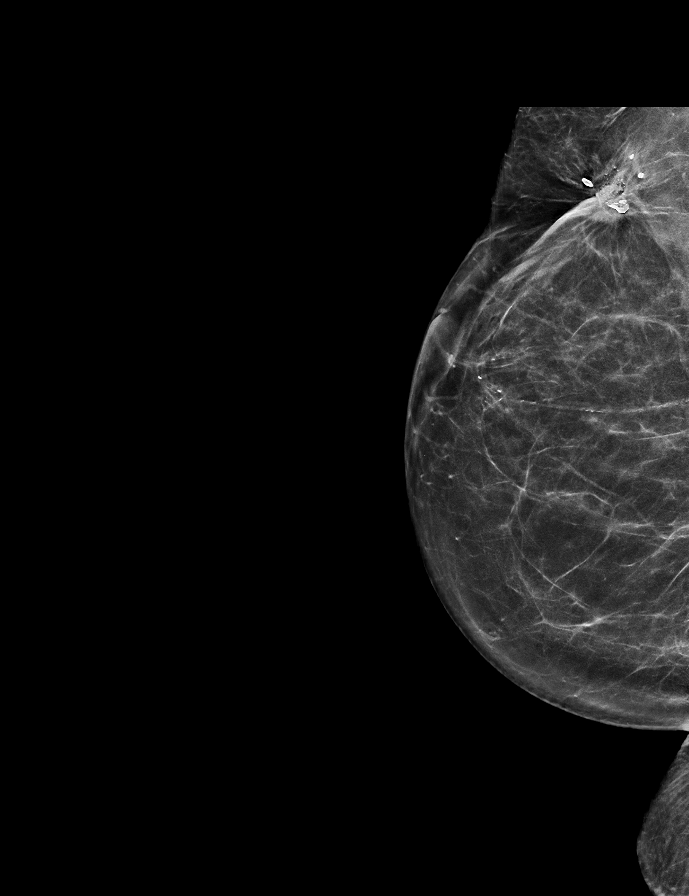

[R CC synth-2D]
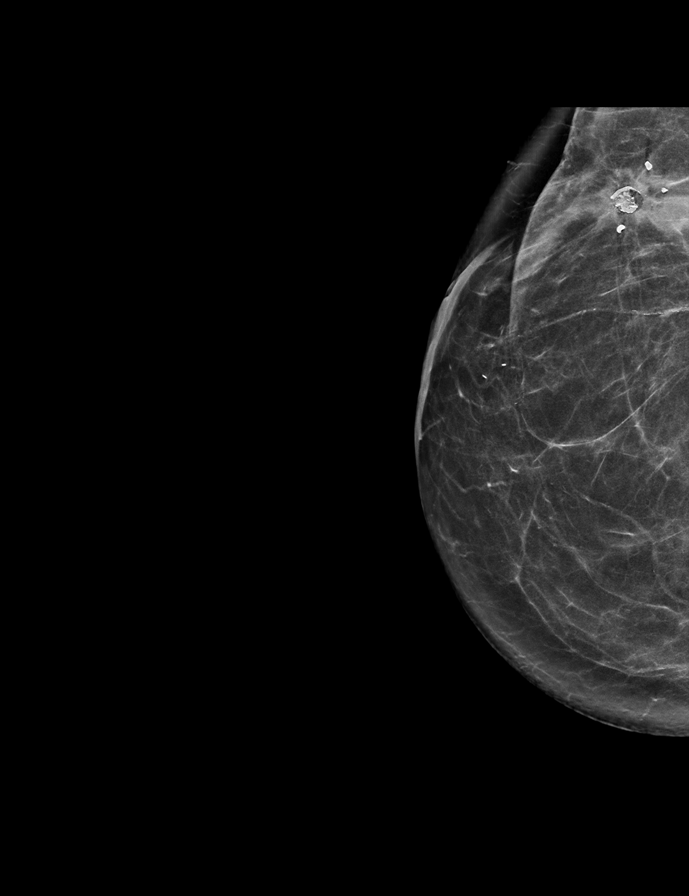

[L CC synth-2D]
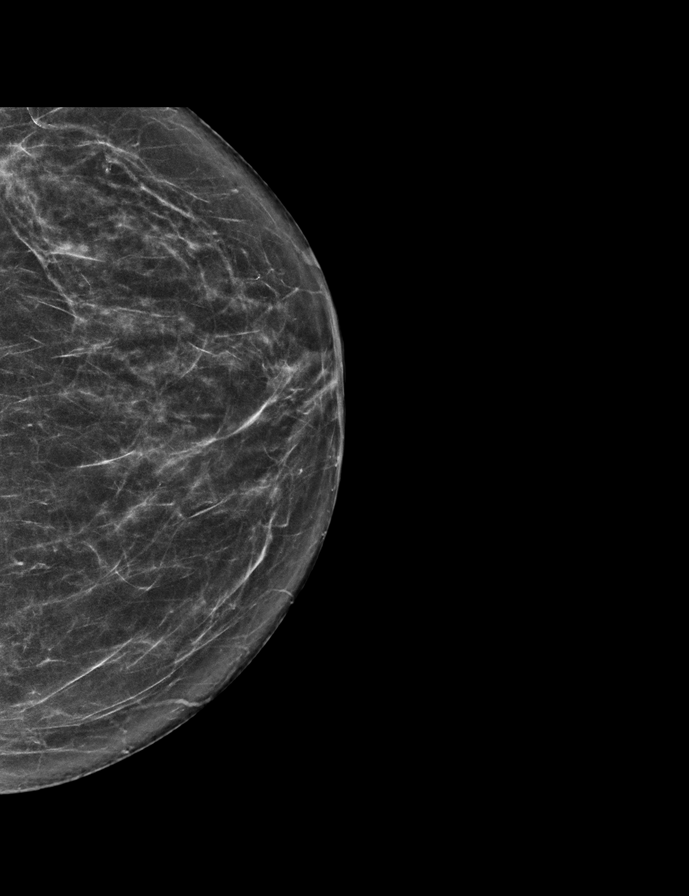

[L MLO synth-2D]
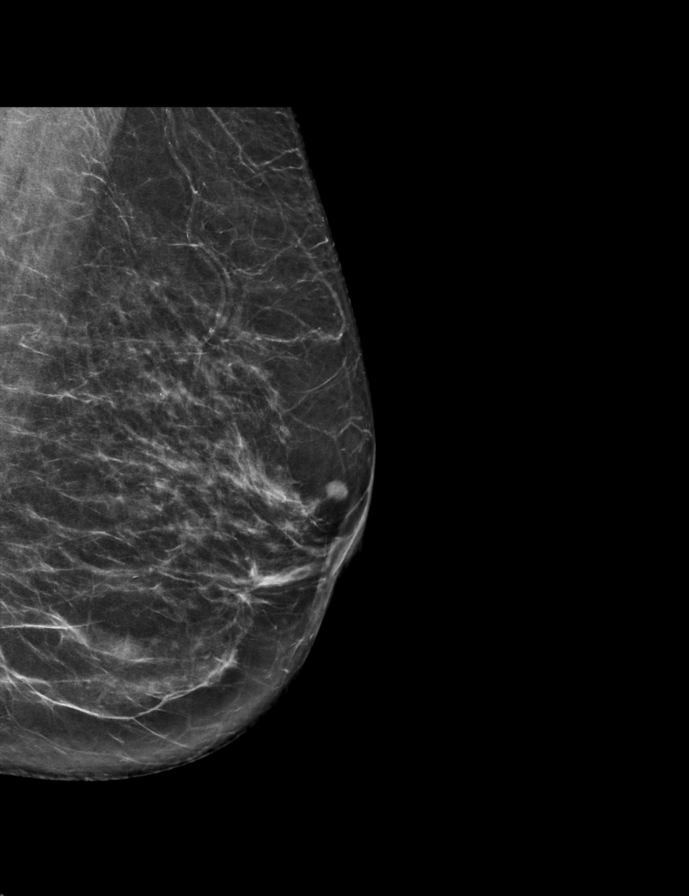

[L CC tomo · 2 of 60 frames shown]
[frame 20/60]
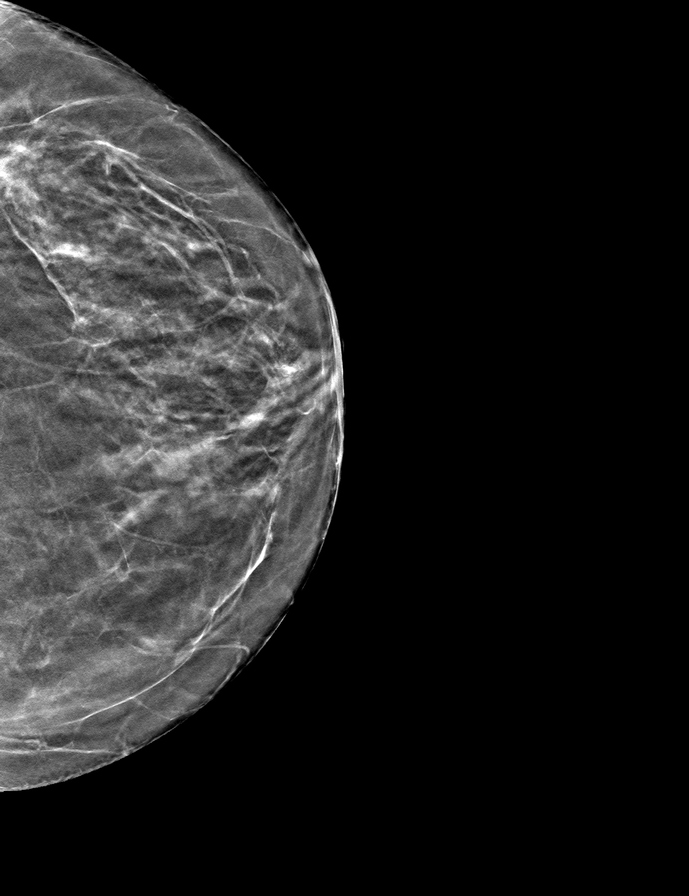
[frame 31/60]
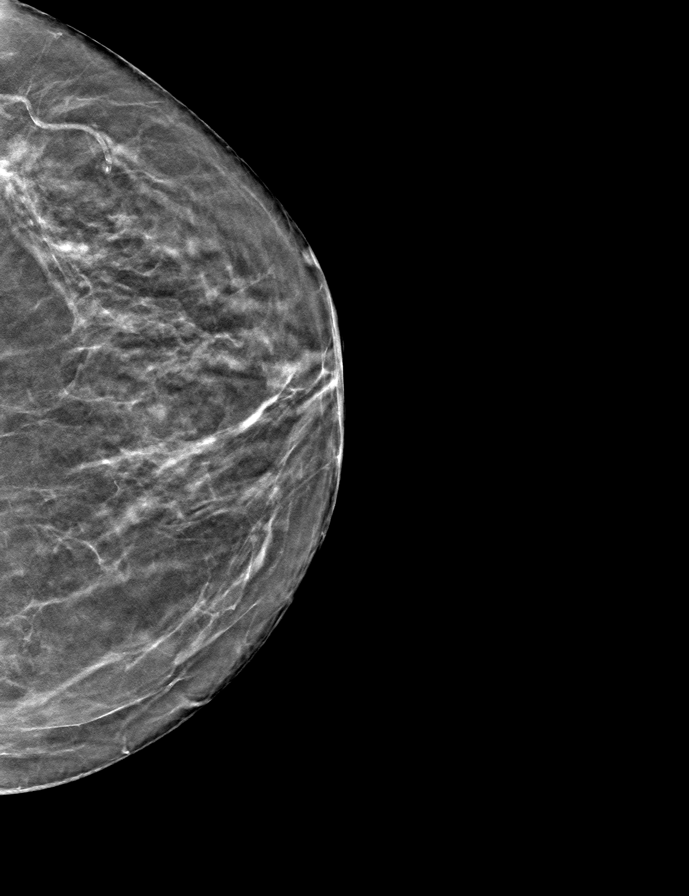

[R CC tomo · tomo slice 32/63.0]
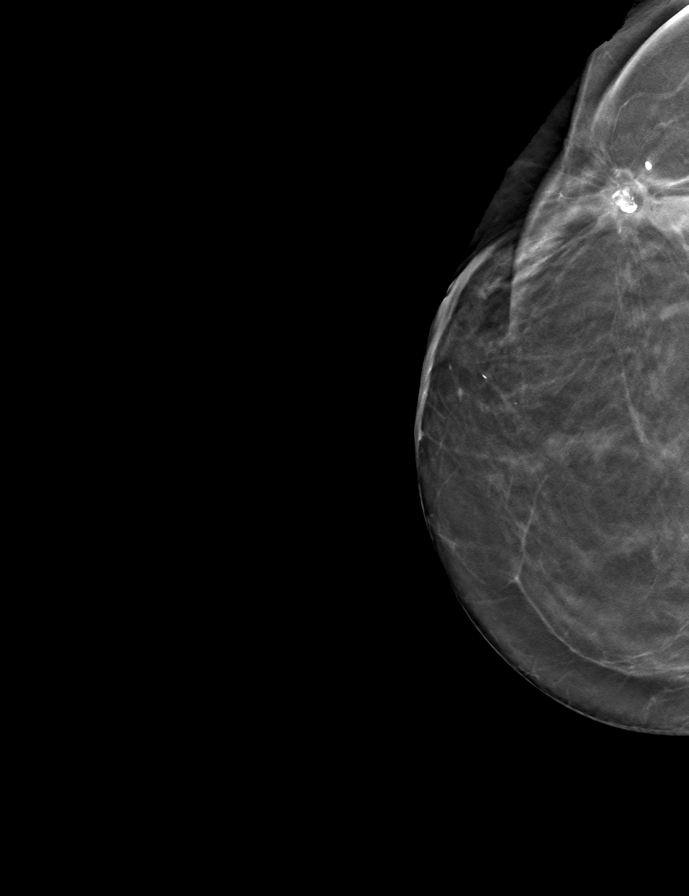

[R MLO tomo · tomo slice 31/60.0]
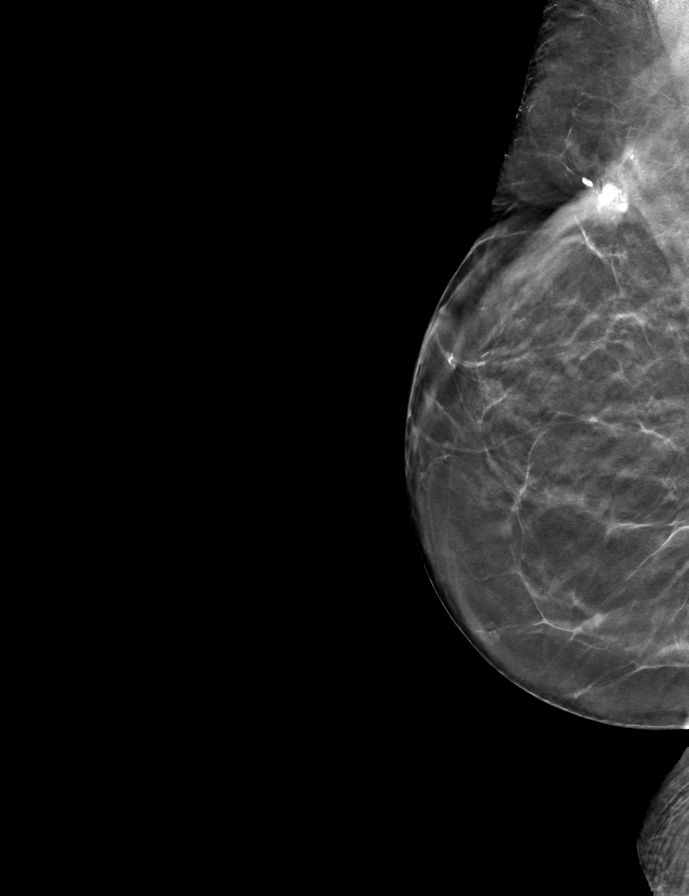

[L MLO tomo · tomo slice 33/65.0]
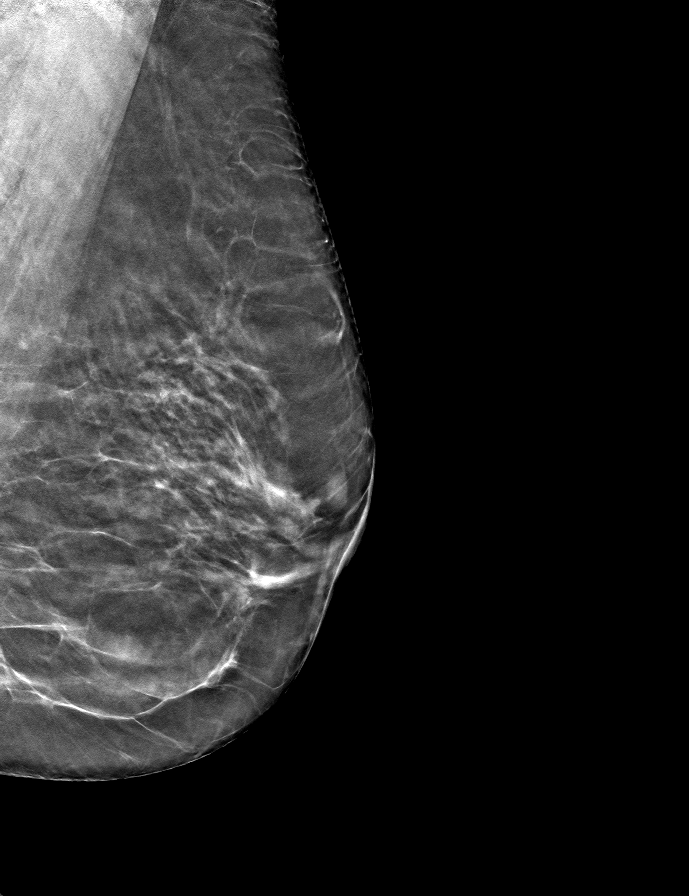

[9 of 24 positions shown; findings below may reference images not displayed]

ACR Breast Density Category b: There are scattered areas of
fibroglandular density.
FINDINGS: There are no findings suspicious for malignancy. Images were
processed with CAD.
IMPRESSION: No mammographic evidence of malignancy. A result letter of this
screening mammogram will be mailed directly to the patient.

RECOMMENDATION:
Screening mammogram in one year. (Code:[id number])

BI-RADS CATEGORY  1: Negative.

## 2020-06-13 DIAGNOSIS — Z79899 Other long term (current) drug therapy: Secondary | ICD-10-CM | POA: Diagnosis not present

## 2020-06-13 DIAGNOSIS — I10 Essential (primary) hypertension: Secondary | ICD-10-CM | POA: Diagnosis not present

## 2020-06-13 DIAGNOSIS — E7800 Pure hypercholesterolemia, unspecified: Secondary | ICD-10-CM | POA: Diagnosis not present

## 2020-06-13 DIAGNOSIS — K219 Gastro-esophageal reflux disease without esophagitis: Secondary | ICD-10-CM | POA: Diagnosis not present

## 2020-09-06 ENCOUNTER — Ambulatory Visit
Admission: RE | Admit: 2020-09-06 | Discharge: 2020-09-06 | Disposition: A | Discharge status: Home / Self Care | Payer: Medicare PPO | Source: Ambulatory Visit | Attending: Nurse Practitioner | Admitting: Nurse Practitioner

## 2020-09-06 ENCOUNTER — Other Ambulatory Visit (HOSPITAL_COMMUNITY): Payer: Self-pay | Admitting: Nurse Practitioner

## 2020-09-06 DIAGNOSIS — I10 Essential (primary) hypertension: Secondary | ICD-10-CM

## 2020-09-06 LAB — CBC WITH DIFF
BASOPHIL #: <0.1 10*3/uL (ref <=0.20)
BASOPHIL %: 1 %
EOSINOPHIL #: 0.13 10*3/uL (ref <=0.50)
EOSINOPHIL %: 3 %
HCT: 45.9 % (ref 34.8–46.0)
HGB: 15.1 g/dL (ref 11.5–16.0)
IMMATURE GRANULOCYTE #: <0.1 10*3/uL (ref <0.10)
IMMATURE GRANULOCYTE %: 0 % (ref 0–1)
LYMPHOCYTE #: 1.24 10*3/uL (ref 1.00–4.80)
LYMPHOCYTE %: 28 %
MCH: 31.7 pg (ref 26.0–32.0)
MCHC: 32.9 g/dL (ref 31.0–35.5)
MCV: 96.2 fL (ref 78.0–100.0)
MONOCYTE #: 0.51 10*3/uL (ref 0.20–1.10)
MONOCYTE %: 12 %
MPV: 12.3 fL (ref 8.7–12.5)
NEUTROPHIL #: 2.46 10*3/uL (ref 1.50–7.70)
NEUTROPHIL %: 56 %
PLATELETS: 198 10*3/uL (ref 150–400)
RBC: 4.77 10*6/uL (ref 3.85–5.22)
RDW-CV: 13.6 % (ref 11.5–15.5)
WBC: 4.4 10*3/uL (ref 3.7–11.0)

## 2020-09-06 LAB — CBC/DIFF - CLIENT CONSOLIDATED
BASOPHIL #: <0.1 10*3/uL (ref <=0.20)
BASOPHIL %: 1 %
EOSINOPHIL #: 0.13 10*3/uL (ref <=0.50)
EOSINOPHIL %: 3 %
HCT: 45.9 % (ref 34.8–46.0)
HGB: 15.1 g/dL (ref 11.5–16.0)
IMMATURE GRANULOCYTE #: <0.1 10*3/uL (ref <0.10)
IMMATURE GRANULOCYTE %: 0 % (ref 0–1)
LYMPHOCYTE #: 1.24 10*3/uL (ref 1.00–4.80)
LYMPHOCYTE %: 28 %
MCH: 31.7 pg (ref 26.0–32.0)
MCHC: 32.9 g/dL (ref 31.0–35.5)
MCV: 96.2 fL (ref 78.0–100.0)
MONOCYTE #: 0.51 10*3/uL (ref 0.20–1.10)
MONOCYTE %: 12 %
MPV: 12.3 fL (ref 8.7–12.5)
NEUTROPHIL #: 2.46 10*3/uL (ref 1.50–7.70)
NEUTROPHIL %: 56 %
PLATELETS: 198 10*3/uL (ref 150–400)
RBC: 4.77 10*6/uL (ref 3.85–5.22)
RDW-CV: 13.6 % (ref 11.5–15.5)
WBC: 4.4 10*3/uL (ref 3.7–11.0)

## 2020-09-06 LAB — COMPREHENSIVE METABOLIC PNL, FASTING
ALBUMIN: 4.1 g/dL (ref 3.4–4.8)
ALKALINE PHOSPHATASE: 116 U/L (ref 55–145)
ALT (SGPT): 20 U/L (ref 8–22)
ANION GAP: 9 mmol/L (ref 4–13)
AST (SGOT): 28 U/L (ref 8–45)
BILIRUBIN TOTAL: 0.6 mg/dL (ref 0.3–1.3)
BUN/CREA RATIO: 14 (ref 6–22)
BUN: 15 mg/dL (ref 8–25)
CALCIUM: 10 mg/dL (ref 8.8–10.2)
CHLORIDE: 105 mmol/L (ref 96–111)
CO2 TOTAL: 27 mmol/L (ref 23–31)
CREATININE: 1.05 mg/dL (ref 0.60–1.05)
GLUCOSE: 108 mg/dL — ABNORMAL HIGH (ref 70–99)
POTASSIUM: 4.5 mmol/L (ref 3.5–5.1)
PROTEIN TOTAL: 6.6 g/dL (ref 5.6–7.6)
SODIUM: 141 mmol/L (ref 136–145)

## 2020-09-06 LAB — LIPID PANEL
CHOL/HDL RATIO: 5.2
CHOLESTEROL: 274 mg/dL — ABNORMAL HIGH (ref 100–200)
HDL CHOL: 53 mg/dL (ref >=50)
LDL CALC: 187 mg/dL — ABNORMAL HIGH (ref <100)
NON-HDL: 221 mg/dL — ABNORMAL HIGH (ref <=190)
TRIGLYCERIDES: 169 mg/dL — ABNORMAL HIGH (ref <150)
VLDL CALC: 34 mg/dL — ABNORMAL HIGH (ref <30)

## 2022-03-29 ENCOUNTER — Ambulatory Visit (INDEPENDENT_AMBULATORY_CARE_PROVIDER_SITE_OTHER): Payer: Self-pay | Admitting: Ophthalmology

## 2022-04-11 ENCOUNTER — Ambulatory Visit (INDEPENDENT_AMBULATORY_CARE_PROVIDER_SITE_OTHER): Payer: Self-pay | Admitting: Ophthalmology

## 2022-05-16 ENCOUNTER — Encounter (INDEPENDENT_AMBULATORY_CARE_PROVIDER_SITE_OTHER): Payer: Self-pay | Admitting: Ophthalmology

## 2022-05-16 ENCOUNTER — Other Ambulatory Visit: Payer: Self-pay

## 2022-05-16 ENCOUNTER — Ambulatory Visit: Payer: Medicare PPO | Attending: Ophthalmology | Admitting: Ophthalmology

## 2022-05-16 DIAGNOSIS — H26492 Other secondary cataract, left eye: Secondary | ICD-10-CM | POA: Insufficient documentation

## 2022-05-16 DIAGNOSIS — H04123 Dry eye syndrome of bilateral lacrimal glands: Secondary | ICD-10-CM | POA: Insufficient documentation

## 2022-05-16 DIAGNOSIS — H43393 Other vitreous opacities, bilateral: Secondary | ICD-10-CM | POA: Insufficient documentation

## 2022-05-16 HISTORY — PX: HX YAG: 210000615

## 2022-05-16 NOTE — Progress Notes (Addendum)
[place], [place]  [place] [address]  Operated by [facility]         Patient Name: Jocelyn Stanley  MRN#: [id number]  Birthdate: 01-Sep-1945    Date of Service: 05/16/2022    Chief Complaint    Secondary [place] Mullenbach is a 77 y.o. female who presents today for evaluation/consultation of:  HPI    Dr. [name] referred pt for evaluation. Pt states vision OS is blurry and unable to see the words on the TV. No problems with OD. Had cataract surgery 2 years ago in [place]. Using artificial tears daily OU. Pt notes occasional floater OU; no flashes.  Last edited by Prudencio Pair, Sagrario Lineberry. Arbie Cookey, MD on 05/25/2022  5:46 AM.        ROS    Negative for: Constitutional, Gastrointestinal, Neurological, Skin, Genitourinary, Musculoskeletal, HENT, Endocrine, Cardiovascular, Eyes, Respiratory, Psychiatric, Allergic/Imm, Heme/Lymph  Last edited by Gaylord Shih, COA on 05/16/2022  1:32 PM.         All other systems Negative    Gaylord Shih, COA  05/16/2022, 13:44        Base Eye Exam       Visual Acuity (Snellen - Linear)         Right Left    Dist cc 20/30 20/400    Dist ph cc 20/25 NI      Correction: Glasses              Tonometry (Tonopen, 1:43 PM)         Right Left    Pressure 10 07              Pupils         Pupils APD    Right PERRL None    Left PERRL None              Visual Fields         Right Left     Full Full              Extraocular Movement         Right Left     Full Full              Neuro/Psych       Oriented x3: Yes    Mood/Affect: Normal              Dilation       Both eyes: 1.0% Mydriacyl, 2.5% Phenylephrine @ 1:43 PM                  Additional Tests       Glare Testing (Hark)         High    Right 20/40    Left                   Slit Lamp and Fundus Exam       External Exam         Right Left    External Normal Normal              Slit Lamp Exam         Right Left    Lids/Lashes Normal Normal    Conjunctiva/Sclera White and quiet White and quiet     Cornea Clear Clear    Anterior Chamber Deep and quiet Deep and quiet    Iris Round and reactive  Round and reactive    Lens Posterior chamber intraocular lens Posterior chamber intraocular lens, 3+ Posterior capsular opacification    Anterior Vitreous Vitreous syneresis Vitreous syneresis              Fundus Exam         Right Left    Disc Normal Normal    Macula Normal Normal    Vessels Normal Normal    Periphery Normal Normal, no retinal tear, no retinal detachment                  Refraction       Wearing Rx         Sphere Cylinder Axis Add    Right -1.00 +3.00 095 +2.50    Left -1.25 +3.00 089 +2.50      Age: 74 years                    MD Addition to HPI: pt notes blurred vision left eye; also a few  floaters.          ENCOUNTER DIAGNOSES     ICD-10-CM   1. Posterior capsular opacification of left eye, obscuring vision  H26.492   2. Dry eyes  H04.123   3. Vitreous floaters of both eyes  H43.393     No orders of the defined types were placed in this encounter.      Ophthalmic Plan of Care:    Counseled pt regarding above conditions:    PCO OS  --visually significant  --discussed r/b/a of YAG laser for this and pt elected to proceed    Dry eyes  --cont PF ATs    Floaters  --discussed  --no retinal pathology OU    Follow up:    I have asked Zian Mohamed to follow up in 1 month or prn          I have seen and examined the above patient. I discussed the above diagnoses listed in the assessment and the above ophthalmic plan of care with the patient and patient's family. All questions were answered. I reviewed and, when necessary, made changes to the technician/resident note, documented ophthalmology exam, chief complaint, history of present illness, allergies, review of systems, past medical, past surgical, family and social history. I personally reviewed and interpreted all testing and/or imaging performed at this visit and agree with the resident's or fellow's interpretation. Any exceptions/additions are  edited/noted in the relevant encounter fields.      [name], MD  05/17/2022, 05:47

## 2022-05-25 ENCOUNTER — Encounter (INDEPENDENT_AMBULATORY_CARE_PROVIDER_SITE_OTHER): Payer: Self-pay | Admitting: Ophthalmology

## 2022-05-25 NOTE — Procedures (Signed)
Jocelyn Stanley, [address]   [place]  [place] [address]  Operated by [facility]  Procedure Note    Name: [name] MRN:  [id number]   Date: 05/16/2022 Age: 77 y.o.  DOB:   July 24, 1945       69485 - DISCISSION OF SECONDARY MEMBRANOUS CATARACT; LASER SURGERY (YAG LASER) (1 OR MORE STAGES) (AMB ONLY)    Performed by: Prudencio Pair, Caeley Dohrmann. Arbie Cookey, MD  Authorized by: Prudencio Pair, Quincy Prisco. Arbie Cookey, MD    Time Out:     Immediately before the procedure, a time out was called:  Yes    Patient verified:  Yes    Procedure Verified:  Yes    Site Verified:  Yes  Documentation:                                                       Laser Operative Report           Indication/Diagnosis:  PCO OS    Type of laser performed: YAG capsulotomy OS    Informed Consent Obtained?  yes  Surgical Pause:  yes    Time:  yes    Anesthesia :   Topical    Eye: OS       Power/Energy 2.0  Half Fluence N/A  Spot Size N/a microns  Duration N/a seconds  Number 33    YAG laser    Findings/Additional Notes/Comment: none  Other: None  Conditions/complications: None  The patient tolerated the procedure well and left the exam room in good condition.    The postoperative instructions were given, including medications and activity level, as well as a follow up appointment.      [name], MD 05/17/2022, 05:50  Late entry           Ariyan Sinnett. Edman Circle, MD

## 2022-06-13 ENCOUNTER — Ambulatory Visit (INDEPENDENT_AMBULATORY_CARE_PROVIDER_SITE_OTHER): Payer: Self-pay | Admitting: Ophthalmology

## 2022-07-18 ENCOUNTER — Ambulatory Visit: Payer: Medicare PPO | Attending: Ophthalmology | Admitting: Ophthalmology

## 2022-07-18 ENCOUNTER — Other Ambulatory Visit: Payer: Self-pay

## 2022-07-18 ENCOUNTER — Encounter (INDEPENDENT_AMBULATORY_CARE_PROVIDER_SITE_OTHER): Payer: Self-pay | Admitting: Ophthalmology

## 2022-07-18 DIAGNOSIS — Z9889 Other specified postprocedural states: Secondary | ICD-10-CM | POA: Insufficient documentation

## 2022-07-18 DIAGNOSIS — H26492 Other secondary cataract, left eye: Secondary | ICD-10-CM | POA: Insufficient documentation

## 2022-07-18 DIAGNOSIS — H43392 Other vitreous opacities, left eye: Secondary | ICD-10-CM | POA: Insufficient documentation

## 2022-07-18 NOTE — Progress Notes (Addendum)
Jocelyn Stanley, [place]  [place] [address]  Operated by [facility]         Patient Name: Jocelyn Stanley  MRN#: [id number]  Birthdate: May 24, 1945    Date of Service: 07/18/2022    Chief Complaint    Jocelyn Stanley is a 77 y.o. female who presents today for evaluation/consultation of:  HPI       Post-op Eye    In left eye.             Comments    PO YAG OS. Pt states vision OS is clearer, sees a dark tan floater that moves around has had this before the laser. Using artificial tears bid OU.           Last edited by Jocelyn Stanley, [place] on 07/18/2022 12:56 PM.        ROS    Negative for: Constitutional, Gastrointestinal, Neurological, Skin, Genitourinary, Musculoskeletal, HENT, Endocrine, Cardiovascular, Eyes, Respiratory, Psychiatric, Allergic/Imm, Heme/Lymph  Last edited by Jocelyn Stanley, [place] on 07/18/2022 12:54 PM.         All other systems Negative    Jocelyn Stanley, COA  07/18/2022, 12:59             Base Eye Exam       Visual Acuity (Snellen - Linear)         Right Left    Dist cc 20/30 -1 20/25 -1    Dist ph cc NI NI      Correction: Glasses              Tonometry (Tonopen, 12:59 PM)         Right Left    Pressure 10 08              Pupils         Pupils    Right PERRL    Left PERRL              Visual Fields         Right Left     Full Full              Extraocular Movement         Right Left     Full Full              Neuro/Psych       Oriented x3: Yes    Mood/Affect: Normal              Dilation       Left eye: 1.0% Mydriacyl, 2.5% Phenylephrine @ 12:59 PM                  Slit Lamp and Fundus Exam       External Exam         Right Left    External Normal Normal              Slit Lamp Exam         Right Left    Lids/Lashes Normal Normal    Conjunctiva/Sclera White and quiet White and quiet    Cornea Clear Clear    Anterior Chamber Deep and quiet Deep and quiet    Iris Round and reactive Round and reactive    Lens Posterior chamber intraocular lens  Posterior chamber intraocular lens, Open posterior capsule    Anterior Vitreous Vitreous syneresis Vitreous syneresis, no Shafer's  sign, Weiss ring              Fundus Exam         Right Left    Disc  Normal    Macula  Normal    Vessels  Normal    Periphery  Normal, no retinal tear, no retinal detachment                  Refraction       Wearing Rx         Sphere Cylinder Axis Add    Right -1.00 +3.00 095 +2.50    Left -1.25 +3.00 089 +2.50                    MD Addition to HPI: pt notes improved vision left eye; also a has a floater OS that she finds very troublesome. Feels she cannot see to read.          ENCOUNTER DIAGNOSES     ICD-10-CM   1. Posterior capsular opacification of left eye, obscuring vision  H26.492   2. Vitreous floaters of left eye  H43.392       No orders of the defined types were placed in this encounter.      Ophthalmic Plan of Care:    Counseled pt regarding above conditions:    PCO OS  --now with good opening s/p YAG laser for this       Dry eyes  --cont PF ATs    Floaters  --discussed; will be present but may be less noticeable gradually  --no retinal pathology OS    Discussed with pt and daughter that I see no explanation for inability to read but she should see Dr Jocelyn Stanley and check on refraction/other issues. I might also consider obtaining a HVF OU.      Follow up:    I have asked Jocelyn Stanley to follow up with Dr Jocelyn Stanley.        I have seen and examined the above patient. I discussed the above diagnoses listed in the assessment and the above ophthalmic plan of care with the patient and patient's family. All questions were answered. I reviewed and, when necessary, made changes to the technician/resident note, documented ophthalmology exam, chief complaint, history of present illness, allergies, review of systems, past medical, past surgical, family and social history. I personally reviewed and interpreted all testing and/or imaging performed at this visit and agree with the resident's or  fellow's interpretation. Any exceptions/additions are edited/noted in the relevant encounter fields.      Jocelyn Median, MD  07/18/2022, 13:57

## 2024-03-31 NOTE — Telephone Encounter (Signed)
 open in error
